# Patient Record
Sex: Male | Born: 1965 | Race: Black or African American | Hispanic: No | Marital: Married | Smoking: Former smoker
Health system: Southern US, Community
[De-identification: ages and names within clinical notes are randomized; demographics above are authoritative.]

## PROBLEM LIST (undated history)

## (undated) DIAGNOSIS — N201 Calculus of ureter: Secondary | ICD-10-CM

## (undated) DIAGNOSIS — I10 Essential (primary) hypertension: Secondary | ICD-10-CM

## (undated) DIAGNOSIS — G4733 Obstructive sleep apnea (adult) (pediatric): Secondary | ICD-10-CM

## (undated) DIAGNOSIS — D860 Sarcoidosis of lung: Secondary | ICD-10-CM

## (undated) DIAGNOSIS — N3289 Other specified disorders of bladder: Secondary | ICD-10-CM

## (undated) DIAGNOSIS — R319 Hematuria, unspecified: Secondary | ICD-10-CM

## (undated) DIAGNOSIS — N200 Calculus of kidney: Secondary | ICD-10-CM

---

## 2003-11-15 ENCOUNTER — Ambulatory Visit (HOSPITAL_COMMUNITY): Admission: RE | Admit: 2003-11-15 | Discharge: 2003-11-15 | Payer: Self-pay | Admitting: Gastroenterology

## 2003-11-15 ENCOUNTER — Encounter (INDEPENDENT_AMBULATORY_CARE_PROVIDER_SITE_OTHER): Payer: Self-pay | Admitting: *Deleted

## 2007-08-16 ENCOUNTER — Emergency Department (HOSPITAL_COMMUNITY): Admission: EM | Admit: 2007-08-16 | Discharge: 2007-08-16 | Payer: Self-pay | Admitting: Emergency Medicine

## 2010-11-30 NOTE — Op Note (Signed)
NAME:  Derek Bennett, Derek Bennett                          ACCOUNT NO.:  [id number]   MEDICAL RECORD NO.:  [id number]                   PATIENT TYPE:  AMB   LOCATION:  ENDO                                 FACILITY:  [facility]   PHYSICIAN:  John C. Madilyn Fireman, M.D.                 DATE OF BIRTH:  1966-06-12   DATE OF PROCEDURE:  11/15/2003  DATE OF DISCHARGE:                                 OPERATIVE REPORT   PROCEDURE:  Colonoscopy with polypectomy and biopsy.   INDICATION FOR PROCEDURE:  Rectal bleeding and polyp found on flexible  sigmoidoscopy.   PROCEDURE:  The patient was placed in the left lateral decubitus position  and placed on the pulse monitor with continuous low-flow oxygen delivered by  nasal cannula.  He was sedated with 100 mcg IV fentanyl and 10 mg IV Versed.  The Olympus video colonoscope was inserted into the rectum and advanced to  the cecum, confirmed by transillumination of McBurney's point and  visualization of the ileocecal valve and appendiceal orifice.  The prep was  good.  The cecum appeared normal with no masses, polyps, diverticula, or  other mucosal abnormalities.  Within the ascending colon there were several  raised, nodular-appearing areas that had normal overlying mucosa consistent  with submucosal nodules of unknown etiology.  There was no yellowish  appearance to suggest lipomas, and the nodules were somewhat firm when  palpated with closed biopsy forceps.  I took several biopsies of the mucosa  but was doubtful that I sampled the true nodules, which appeared to be  subcutaneous.  The remainder of the ascending, transverse, descending, and  sigmoid appeared normal with no further nodules, polyps, masses,  diverticula, or other mucosal abnormalities.  In the rectum at approximately  12 cm there was an 8 mm sessile polyp, which was removed by snare.  The  remainder of the rectum appeared normal.  The scope was then withdrawn and  the patient returned to the recovery room  in stable condition.  He tolerated  the procedure well, and there were no immediate complications.   IMPRESSION:  1. Subcutaneous ascending colon nodules, significance unclear, atypical in     appearance for polyps.  2. Single rectal polyp.   PLAN:  Await histology.                                               John C. Madilyn Fireman, M.D.    JCH/MEDQ  D:  11/15/2003  T:  11/15/2003  Job:  [id number]   cc:   Dellis Anes. Idell Pickles, M.D.  [address]  [place]  [place] [address]  Fax: [phone]

## 2012-04-09 ENCOUNTER — Emergency Department (HOSPITAL_COMMUNITY)
Admission: EM | Admit: 2012-04-09 | Discharge: 2012-04-09 | Disposition: A | Discharge status: Home / Self Care | Payer: Managed Care, Other (non HMO) | Source: Home / Self Care | Attending: Family Medicine | Admitting: Family Medicine

## 2012-04-09 ENCOUNTER — Encounter (HOSPITAL_COMMUNITY): Payer: Self-pay | Admitting: *Deleted

## 2012-04-09 DIAGNOSIS — IMO0001 Reserved for inherently not codable concepts without codable children: Secondary | ICD-10-CM

## 2012-04-09 DIAGNOSIS — L03019 Cellulitis of unspecified finger: Secondary | ICD-10-CM

## 2012-04-09 HISTORY — DX: Essential (primary) hypertension: I10

## 2012-04-09 MED ORDER — DOXYCYCLINE HYCLATE 100 MG PO CAPS: 100.0000 mg | ORAL_CAPSULE | Freq: Two times a day (BID) | ORAL | Status: DC

## 2012-04-09 NOTE — ED Provider Notes (Signed)
History     CSN: [id number]  Arrival date & time 04/09/12  1527   First MD Initiated Contact with Patient 04/09/12 1553      Chief Complaint  Patient presents with  . Hand Pain    (Consider location/radiation/quality/duration/timing/severity/associated sxs/prior treatment) Patient is a 46 y.o. male presenting with hand pain. The history is provided by the patient and the spouse.  Hand Pain This is a new problem. The current episode started 2 days ago. The problem has been gradually worsening.    Past Medical History  Diagnosis Date  . Hypertension     History reviewed. No pertinent past surgical history.  No family history on file.  History  Substance Use Topics  . Smoking status: Not on file  . Smokeless tobacco: Not on file  . Alcohol Use: No      Review of Systems  Constitutional: Negative.   Musculoskeletal: Positive for joint swelling.  Skin: Positive for rash.    Allergies  Review of patient's allergies indicates not on file.  Home Medications   Current Outpatient Rx  Name Route Sig Dispense Refill  . LISINOPRIL PO Oral Take by mouth.    . DOXYCYCLINE HYCLATE 100 MG PO CAPS Oral Take 1 capsule (100 mg total) by mouth 2 (two) times daily. 20 capsule 0    BP 114/71  Pulse 85  Temp 98.5 F (36.9 C) (Oral)  Resp 17  SpO2 97%  Physical Exam  Nursing note and vitals reviewed. Constitutional: He is oriented to person, place, and time. He appears well-developed and well-nourished.  Musculoskeletal: He exhibits tenderness.       Hands: Neurological: He is alert and oriented to person, place, and time.  Skin: Skin is warm and dry.    ED Course  INCISION AND DRAINAGE Date/Time: 04/09/2012 4:33 PM Performed by: Linna Hoff Authorized by: Bradd Canary D Consent: Verbal consent obtained. Consent given by: patient and spouse Type: abscess Body area: upper extremity Location details: right ring finger Local anesthetic: topical  anesthetic Patient sedated: no Scalpel size: 11 Incision type: single straight Drainage: purulent Drainage amount: moderate Patient tolerance: Patient tolerated the procedure well with no immediate complications. Comments: Culture obtained.   (including critical care time)   Labs Reviewed  WOUND CULTURE   No results found.   1. Paronychia of fourth finger, right       MDM          Linna Hoff, MD 04/09/12 [phone]

## 2012-04-09 NOTE — ED Notes (Signed)
Pt  Has  A  painfull   Swollen       r  Ring  Finger  With  Pus  Under the  Nailbed         Symptoms  X  sev  Days           Pt  denys    Any  Injury            The  Nailbed is  painfull  To the  Touch

## 2012-04-12 LAB — WOUND CULTURE: Special Requests: NORMAL

## 2012-04-15 ENCOUNTER — Telehealth (HOSPITAL_COMMUNITY): Payer: Self-pay | Admitting: *Deleted

## 2012-04-15 NOTE — ED Notes (Signed)
Wound culture hand: Abundant MRSA. Pt. adequately treated with Doxycycline.  I called and left a message to call. Vassie Moselle 04/15/2012

## 2012-04-16 ENCOUNTER — Telehealth (HOSPITAL_COMMUNITY): Payer: Self-pay | Admitting: *Deleted

## 2012-04-16 NOTE — ED Notes (Signed)
Pt. called back.  Pt. verified x 2 and given results.  Pt. told he was adequately treated for MRSA with Doxycycline. Pt. given MRSA instructions and voiced understanding. Pt. wanted me to tell his wife what I told him. I referred them to the [facility] website for further reading on it. Vassie Moselle 04/16/2012

## 2014-05-11 ENCOUNTER — Encounter (HOSPITAL_COMMUNITY): Payer: Self-pay | Admitting: Emergency Medicine

## 2014-05-11 ENCOUNTER — Emergency Department (HOSPITAL_COMMUNITY): Payer: PRIVATE HEALTH INSURANCE

## 2014-05-11 ENCOUNTER — Inpatient Hospital Stay (HOSPITAL_COMMUNITY)
Admission: EM | Admit: 2014-05-11 | Discharge: 2014-05-13 | DRG: 854 | Disposition: A | Discharge status: Home / Self Care | Payer: PRIVATE HEALTH INSURANCE | Attending: Internal Medicine | Admitting: Internal Medicine

## 2014-05-11 DIAGNOSIS — A419 Sepsis, unspecified organism: Principal | ICD-10-CM

## 2014-05-11 DIAGNOSIS — Y929 Unspecified place or not applicable: Secondary | ICD-10-CM

## 2014-05-11 DIAGNOSIS — I10 Essential (primary) hypertension: Secondary | ICD-10-CM

## 2014-05-11 DIAGNOSIS — B9620 Unspecified Escherichia coli [E. coli] as the cause of diseases classified elsewhere: Secondary | ICD-10-CM | POA: Diagnosis present

## 2014-05-11 DIAGNOSIS — S0003XA Contusion of scalp, initial encounter: Secondary | ICD-10-CM

## 2014-05-11 DIAGNOSIS — Z87891 Personal history of nicotine dependence: Secondary | ICD-10-CM | POA: Diagnosis not present

## 2014-05-11 DIAGNOSIS — D869 Sarcoidosis, unspecified: Secondary | ICD-10-CM | POA: Diagnosis present

## 2014-05-11 DIAGNOSIS — D862 Sarcoidosis of lung with sarcoidosis of lymph nodes: Secondary | ICD-10-CM

## 2014-05-11 DIAGNOSIS — R591 Generalized enlarged lymph nodes: Secondary | ICD-10-CM | POA: Insufficient documentation

## 2014-05-11 DIAGNOSIS — R000 Tachycardia, unspecified: Secondary | ICD-10-CM

## 2014-05-11 DIAGNOSIS — N390 Urinary tract infection, site not specified: Secondary | ICD-10-CM | POA: Diagnosis present

## 2014-05-11 DIAGNOSIS — R55 Syncope and collapse: Secondary | ICD-10-CM

## 2014-05-11 DIAGNOSIS — W1839XA Other fall on same level, initial encounter: Secondary | ICD-10-CM | POA: Diagnosis present

## 2014-05-11 DIAGNOSIS — R509 Fever, unspecified: Secondary | ICD-10-CM

## 2014-05-11 DIAGNOSIS — R590 Localized enlarged lymph nodes: Secondary | ICD-10-CM | POA: Insufficient documentation

## 2014-05-11 DIAGNOSIS — S0081XA Abrasion of other part of head, initial encounter: Secondary | ICD-10-CM | POA: Diagnosis present

## 2014-05-11 LAB — COMPREHENSIVE METABOLIC PANEL
ALT: 34 U/L (ref 0–53)
ANION GAP: 14 (ref 5–15)
AST: 31 U/L (ref 0–37)
Albumin: 3.4 g/dL — ABNORMAL LOW (ref 3.5–5.2)
Alkaline Phosphatase: 56 U/L (ref 39–117)
BILIRUBIN TOTAL: 0.5 mg/dL (ref 0.3–1.2)
BUN: 15 mg/dL (ref 6–23)
CHLORIDE: 98 meq/L (ref 96–112)
CO2: 20 meq/L (ref 19–32)
CREATININE: 1.24 mg/dL (ref 0.50–1.35)
Calcium: 9 mg/dL (ref 8.4–10.5)
GFR, EST AFRICAN AMERICAN: 77 mL/min — AB (ref >90)
GFR, EST NON AFRICAN AMERICAN: 67 mL/min — AB (ref >90)
Glucose, Bld: 104 mg/dL — ABNORMAL HIGH (ref 70–99)
Potassium: 4.3 mEq/L (ref 3.7–5.3)
Sodium: 132 mEq/L — ABNORMAL LOW (ref 137–147)
Total Protein: 8 g/dL (ref 6.0–8.3)

## 2014-05-11 LAB — URINE MICROSCOPIC-ADD ON

## 2014-05-11 LAB — URINALYSIS, ROUTINE W REFLEX MICROSCOPIC
Bilirubin Urine: NEGATIVE
Glucose, UA: NEGATIVE mg/dL
Hgb urine dipstick: NEGATIVE
KETONES UR: NEGATIVE mg/dL
NITRITE: POSITIVE — AB
PROTEIN: 30 mg/dL — AB
Specific Gravity, Urine: 1.021 (ref 1.005–1.030)
UROBILINOGEN UA: 1 mg/dL (ref 0.0–1.0)
pH: 7 (ref 5.0–8.0)

## 2014-05-11 LAB — CBC WITH DIFFERENTIAL/PLATELET
BASOS PCT: 0 % (ref 0–1)
Basophils Absolute: 0 10*3/uL (ref 0.0–0.1)
EOS PCT: 0 % (ref 0–5)
Eosinophils Absolute: 0 10*3/uL (ref 0.0–0.7)
HEMATOCRIT: 35.5 % — AB (ref 39.0–52.0)
HEMOGLOBIN: 12.6 g/dL — AB (ref 13.0–17.0)
LYMPHS PCT: 14 % (ref 12–46)
Lymphs Abs: 2 10*3/uL (ref 0.7–4.0)
MCH: 29.9 pg (ref 26.0–34.0)
MCHC: 35.5 g/dL (ref 30.0–36.0)
MCV: 84.3 fL (ref 78.0–100.0)
MONO ABS: 1.7 10*3/uL — AB (ref 0.1–1.0)
MONOS PCT: 11 % (ref 3–12)
NEUTROS ABS: 11.2 10*3/uL — AB (ref 1.7–7.7)
Neutrophils Relative %: 75 % (ref 43–77)
Platelets: 256 10*3/uL (ref 150–400)
RBC: 4.21 MIL/uL — ABNORMAL LOW (ref 4.22–5.81)
RDW: 13.2 % (ref 11.5–15.5)
WBC: 14.9 10*3/uL — ABNORMAL HIGH (ref 4.0–10.5)

## 2014-05-11 LAB — I-STAT CG4 LACTIC ACID, ED
LACTIC ACID, VENOUS: 0.95 mmol/L (ref 0.5–2.2)
Lactic Acid, Venous: 1.2 mmol/L (ref 0.5–2.2)

## 2014-05-11 LAB — TROPONIN I: Troponin I: <0.3 ng/mL (ref <0.30)

## 2014-05-11 MED ORDER — ATROPINE SULFATE 1 % OP SOLN
1.0000 [drp] | Freq: Every day | OPHTHALMIC | Status: DC
  Administered 2014-05-11: 1 [drp] via OPHTHALMIC
  Filled 2014-05-11: qty 2

## 2014-05-11 MED ORDER — DEXTROSE 5 % IV SOLN: 1.0000 g | INTRAVENOUS | Status: DC

## 2014-05-11 MED ORDER — ACETAMINOPHEN 650 MG RE SUPP: 650.0000 mg | Freq: Four times a day (QID) | RECTAL | Status: DC | PRN

## 2014-05-11 MED ORDER — ONDANSETRON HCL 4 MG/2ML IJ SOLN: 4.0000 mg | Freq: Four times a day (QID) | INTRAMUSCULAR | Status: DC | PRN

## 2014-05-11 MED ORDER — ACETAMINOPHEN 325 MG PO TABS: 650.0000 mg | ORAL_TABLET | Freq: Four times a day (QID) | ORAL | Status: DC | PRN

## 2014-05-11 MED ORDER — FOLIC ACID 1 MG PO TABS
1.0000 mg | ORAL_TABLET | Freq: Every day | ORAL | Status: DC
  Administered 2014-05-12 – 2014-05-13 (×2): 1 mg via ORAL
  Filled 2014-05-11 (×2): qty 1

## 2014-05-11 MED ORDER — HOMATROPINE HBR 5 % OP SOLN
1.0000 [drp] | Freq: Four times a day (QID) | OPHTHALMIC | Status: DC
  Filled 2014-05-11: qty 5

## 2014-05-11 MED ORDER — ADULT MULTIVITAMIN W/MINERALS CH
1.0000 | ORAL_TABLET | Freq: Every day | ORAL | Status: DC
  Administered 2014-05-12 – 2014-05-13 (×2): 1 via ORAL
  Filled 2014-05-11 (×2): qty 1

## 2014-05-11 MED ORDER — SODIUM CHLORIDE 0.9 % IV SOLN
INTRAVENOUS | Status: DC
  Administered 2014-05-12 – 2014-05-13 (×3): via INTRAVENOUS

## 2014-05-11 MED ORDER — ONDANSETRON HCL 4 MG PO TABS: 4.0000 mg | ORAL_TABLET | Freq: Four times a day (QID) | ORAL | Status: DC | PRN

## 2014-05-11 MED ORDER — SODIUM CHLORIDE 0.9 % IV BOLUS (SEPSIS)
1000.0000 mL | INTRAVENOUS | Status: AC
  Administered 2014-05-11 (×2): 1000 mL via INTRAVENOUS

## 2014-05-11 MED ORDER — HEPARIN SODIUM (PORCINE) 5000 UNIT/ML IJ SOLN
5000.0000 [IU] | Freq: Three times a day (TID) | INTRAMUSCULAR | Status: DC
  Administered 2014-05-11 – 2014-05-12 (×2): 5000 [IU] via SUBCUTANEOUS
  Filled 2014-05-11 (×5): qty 1

## 2014-05-11 MED ORDER — VITAMIN B-1 100 MG PO TABS
100.0000 mg | ORAL_TABLET | Freq: Every day | ORAL | Status: DC
  Administered 2014-05-12 – 2014-05-13 (×2): 100 mg via ORAL
  Filled 2014-05-11 (×2): qty 1

## 2014-05-11 MED ORDER — IOHEXOL 300 MG/ML  SOLN
100.0000 mL | Freq: Once | INTRAMUSCULAR | Status: AC | PRN
  Administered 2014-05-11: 100 mL via INTRAVENOUS

## 2014-05-11 MED ORDER — SODIUM CHLORIDE 0.9 % IV BOLUS (SEPSIS)
500.0000 mL | INTRAVENOUS | Status: AC
  Administered 2014-05-11: 500 mL via INTRAVENOUS

## 2014-05-11 MED ORDER — ACETAMINOPHEN 325 MG PO TABS
650.0000 mg | ORAL_TABLET | Freq: Four times a day (QID) | ORAL | Status: DC | PRN
Start: 2014-05-11 — End: 2014-05-11
  Administered 2014-05-11: 650 mg via ORAL
  Filled 2014-05-11: qty 2

## 2014-05-11 MED ORDER — CEFTRIAXONE SODIUM 2 G IJ SOLR
2.0000 g | Freq: Once | INTRAMUSCULAR | Status: AC
  Administered 2014-05-11: 2 g via INTRAVENOUS
  Filled 2014-05-11: qty 2

## 2014-05-11 NOTE — ED Notes (Signed)
Per EMS-pt had two syncopal episodes today. Head injury and abrasion to left cheek from fall. CBG 93. "seeing spots".

## 2014-05-11 NOTE — H&P (Signed)
[facility] History and Physical  RACER QUAM ZOX:[id number] DOB: [birth year] DOA: 05/11/2014  Referring physician: Dierdre Forth, PA PCP: Mickie Hillier, MD   Chief Complaint: Syncope  HPI: LINDSEY HOMMEL is a 48 y.o. male presents with syncopal episode. Patient states that he has had 2 episodes of syncope. Patient states that he was at work when this happened. He had no chest pain noted. No loss of bowel or bladder control. Patient states that he has had sweating noted. Patient states that he has no headaches noted. Patient states he has a he hit his face and has a bruise there now. He has not had dizziness or light headedness. No history of seizures noted. Patient states that he has had noted his urine to be foul smelling lately and has had decreased urine output. Patient has no prior cardiac history. He has not had strokes in the past. On evaluation he had a CT scan done and this shows significant adenopathy suggesting sarcoid vs lymphoma.   Review of Systems:  Constitutional:  No weight loss, ++night sweats, ++Fevers, ++chills, ++fatigue.  HEENT:  No headaches,  post nasal drip,  Cardio-vascular:  No chest pain, Orthopnea, PND, swelling in lower extremities ++syncope  GI:  No heartburn, indigestion, abdominal pain, nausea, vomiting, diarrhea  Resp:  No shortness of breath with exertion or at rest. ++non-productive cough No coughing up of blood  Skin:  no rash or lesions GU:  ++dysuria, ++change in color of urine, ++smell of urine.  Musculoskeletal:  No joint pain or swelling. No decreased range of motion.  Psych:  No change in mood or affect. No depression or anxiety   Past Medical History  Diagnosis Date  . Hypertension    Past Surgical History  Procedure Laterality Date  . No past surgeries     Social History:  reports that he quit smoking about 25 years ago. His smoking use included Cigars. He has never used smokeless tobacco. He reports that he  drinks alcohol. He reports that he does not use illicit drugs.  Allergies  Allergen Reactions  . Eggs Or Egg-Derived Products Shortness Of Breath    History reviewed. No pertinent family history.   Prior to Admission medications   Medication Sig Start Date End Date Taking? Authorizing Provider  atropine 1 % ophthalmic solution Place 1 drop into both eyes at bedtime.   Yes Historical Provider, MD  homatropine 5 % ophthalmic solution Place 1 drop into both eyes 4 (four) times daily.   Yes Historical Provider, MD  lisinopril-hydrochlorothiazide (PRINZIDE,ZESTORETIC) 20-12.5 MG per tablet Take 1 tablet by mouth daily.   Yes Historical Provider, MD   Physical Exam: Filed Vitals:   05/11/14 1842 05/11/14 1844 05/11/14 2015 05/11/14 2026  BP:  115/60  144/55  Pulse:  105 100   Temp: 102.8 F (39.3 C)   99.4 F (37.4 C)  TempSrc: Rectal   Oral  Resp:  23 19   Weight:      SpO2:  97% 96%     Wt Readings from Last 3 Encounters:  05/11/14 110.678 kg (244 lb)    General:  Appears calm and comfortable Eyes: PERRL, normal lids, irises & conjunctiva ENT: grossly normal hearing, lips & tongue Neck: no masses or thyromegaly Cardiovascular: RRR tachycardia , no m/r/g. No LE edema Respiratory: CTA bilaterally, no w/r/r. Normal respiratory effort. Abdomen: soft, ntnd Skin: no rash or induration seen on limited exam Musculoskeletal: grossly normal tone BUE/BLE Psychiatric: grossly normal mood and affect,  speech fluent and appropriate Neurologic: grossly non-focal.          Labs on Admission:  Basic Metabolic Panel:  Recent Labs Lab 05/11/14 1806  NA 132*  K 4.3  CL 98  CO2 20  GLUCOSE 104*  BUN 15  CREATININE 1.24  CALCIUM 9.0   Liver Function Tests:  Recent Labs Lab 05/11/14 1806  AST 31  ALT 34  ALKPHOS 56  BILITOT 0.5  PROT 8.0  ALBUMIN 3.4*   No results found for this basename: LIPASE, AMYLASE,  in the last 168 hours No results found for this basename:  AMMONIA,  in the last 168 hours CBC:  Recent Labs Lab 05/11/14 1806  WBC 14.9*  NEUTROABS 11.2*  HGB 12.6*  HCT 35.5*  MCV 84.3  PLT 256   Cardiac Enzymes:  Recent Labs Lab 05/11/14 1806  TROPONINI <0.30    BNP (last 3 results) No results found for this basename: PROBNP,  in the last 8760 hours CBG: No results found for this basename: GLUCAP,  in the last 168 hours  Radiological Exams on Admission: Dg Chest 2 View  05/11/2014   CLINICAL DATA:  Syncope.  R55.  EXAM: CHEST  2 VIEW  COMPARISON:  08/16/2007 chest radiograph and cervical spine CT of earlier today  FINDINGS: Midline trachea. Normal heart size. Extensive mediastinal and bilateral hilar adenopathy. No pleural effusion or pneumothorax. Pulmonary interstitial prominence is new since the prior radiograph. Cannot exclude nodule projecting over the right costophrenic angle on the frontal film. 1.3 cm. Not well localized on the lateral.  IMPRESSION: Extensive mediastinal and bilateral hilar adenopathy. Concurrent pulmonary interstitial thickening. Considerations include sarcoidosis, lymphoproliferative disorder such as lymphoma, or small cell lung carcinoma. Further evaluation with contrast-enhanced chest CT should be considered. These results were called by telephone at the time of interpretation on 05/11/2014 at 6:28 pm to Dr. Dierdre ForthHANNAH MUTHERSBAUGH , who verbally acknowledged these results.  Possible right lung base nodule laterally. This would also be better evaluated with chest CT.   Electronically Signed   By: Jeronimo GreavesKyle  Talbot M.D.   On: 05/11/2014 18:31   Ct Head Wo Contrast  05/11/2014   CLINICAL DATA:  Two syncopal episodes.  Fall.  Scalp contusion.  EXAM: CT HEAD WITHOUT CONTRAST  CT CERVICAL SPINE WITHOUT CONTRAST  TECHNIQUE: Multidetector CT imaging of the head and cervical spine was performed following the standard protocol without intravenous contrast. Multiplanar CT image reconstructions of the cervical spine were also  generated.  COMPARISON:  None.  FINDINGS: CT HEAD FINDINGS  Skull and Sinuses:Negative for acute fracture or destructive process. Deformity of the nasal arch appears chronic. No effusion in the imaged paranasal sinuses. Mastoids and middle ears are clear.  Orbits: No acute abnormality.  Brain: No evidence of acute abnormality, such as acute infarction, hemorrhage, hydrocephalus, or mass lesion/mass effect.  CT CERVICAL SPINE FINDINGS  Partially visualized adenopathy in the upper mediastinum. A right upper paratracheal node measures at least 13 mm in short axis. Right supraclavicular lymphadenopathy with a node measuring 2 cm in short axis.  Negative for acute fracture. No traumatic malalignment. Cervical spondylosis with small central disc herniations, most notable at C3-4 and C4-5. These likely contact the ventral cord. No gross cervical canal hematoma or prevertebral swelling.  IMPRESSION: 1. No acute intracranial injury or cervical spine fracture. 2. Upper mediastinal and right supraclavicular lymphadenopathy. Recommend enhanced chest CT.   Electronically Signed   By: Tiburcio PeaJonathan  Watts M.D.   On: 05/11/2014 18:20  Ct Chest W Contrast  05/11/2014   CLINICAL DATA:  Thoracic adenopathy on chest radiograph.Lymphadenopathy R59.1 (ICD-10-CM)  EXAM: CT CHEST, ABDOMEN, AND PELVIS WITH CONTRAST  TECHNIQUE: Multidetector CT imaging of the chest, abdomen and pelvis was performed following the standard protocol during bolus administration of intravenous contrast.  CONTRAST:  100mL OMNIPAQUE IOHEXOL 300 MG/ML  SOLN  COMPARISON:  Chest radiograph of same date and back to 08/16/2007  FINDINGS: CT CHEST FINDINGS  Lungs/Pleura: Probable scarring in the right lower lobe laterally. This corresponds to the plain film abnormality. Example image 53 of series 7.  Bronchovascular nodularity which is bilateral and slightly upper lobe predominant.  No pleural fluid.  Heart/Mediastinum: Right supraclavicular node measures 1.2 cm on  image 4.  No axillary adenopathy. Heart size upper normal, without pericardial effusion. LAD coronary artery atherosclerosis on image 29.  Extensive thoracic adenopathy. Right paratracheal nodal conglomerate measures 4.1 x 2.6 cm on image 21.  Subcarinal nodal mass measures 4.6 x 8.5 cm on image 32.  Right hilar node measures 2.5 cm on image 28. Left infrahilar adenopathy.  Index prevascular node measures 2.6 cm on image 20.  CT ABDOMEN AND PELVIS FINDINGS  Hepatobiliary: Too small to characterize right hepatic lobe lesion at 3 mm. Normal gallbladder, without biliary ductal dilatation.  Pancreas: Normal, without mass or pancreatic ductal dilatation.  Spleen: Normal, without splenomegaly.  Urinary tract: Normal adrenal glands. Interpolar right renal lesion which is too small to characterize. Normal ureters and urinary bladder.  Stomach/Bowel: Normal stomach, without wall thickening. Proximal duodenum is displaced by adenopathy, but nonobstructed. Normal colon, appendix, and terminal ileum. Normal small bowel, without mesenteric adenopathy.  Vascular/Lymphatic: Mild atherosclerosis. Marked upper abdominal adenopathy. Gastrohepatic ligament node measures 3.7 x 5.0 cm on image 60  Portacaval node which measures 4.6 x 5.6 cm on image 70. No pelvic adenopathy.  Reproductive: Normal prostate.  Other:  No significant free fluid.  Fat containing umbilical hernia.  Musculoskeletal: No acute osseous abnormality.  IMPRESSION: 1. Extensive thoracic and upper abdominal adenopathy with peribronchovascular pulmonary micro nodularity. Favor sarcoidosis. Lymphoma is felt less likely. 2. No acute superimposed process. The right lower lobe abnormality on chest radiograph likely represents an area of scarring versus inflammation related to presumed sarcoidosis. 3. Age advanced coronary artery atherosclerosis. Recommend assessment of coronary risk factors and consideration of medical therapy.   Electronically Signed   By: Jeronimo GreavesKyle  Talbot  M.D.   On: 05/11/2014 19:51   Ct Cervical Spine Wo Contrast  05/11/2014   CLINICAL DATA:  Two syncopal episodes.  Fall.  Scalp contusion.  EXAM: CT HEAD WITHOUT CONTRAST  CT CERVICAL SPINE WITHOUT CONTRAST  TECHNIQUE: Multidetector CT imaging of the head and cervical spine was performed following the standard protocol without intravenous contrast. Multiplanar CT image reconstructions of the cervical spine were also generated.  COMPARISON:  None.  FINDINGS: CT HEAD FINDINGS  Skull and Sinuses:Negative for acute fracture or destructive process. Deformity of the nasal arch appears chronic. No effusion in the imaged paranasal sinuses. Mastoids and middle ears are clear.  Orbits: No acute abnormality.  Brain: No evidence of acute abnormality, such as acute infarction, hemorrhage, hydrocephalus, or mass lesion/mass effect.  CT CERVICAL SPINE FINDINGS  Partially visualized adenopathy in the upper mediastinum. A right upper paratracheal node measures at least 13 mm in short axis. Right supraclavicular lymphadenopathy with a node measuring 2 cm in short axis.  Negative for acute fracture. No traumatic malalignment. Cervical spondylosis with small central disc herniations, most notable  at C3-4 and C4-5. These likely contact the ventral cord. No gross cervical canal hematoma or prevertebral swelling.  IMPRESSION: 1. No acute intracranial injury or cervical spine fracture. 2. Upper mediastinal and right supraclavicular lymphadenopathy. Recommend enhanced chest CT.   Electronically Signed   By: Tiburcio Pea M.D.   On: 05/11/2014 18:20   Ct Abdomen Pelvis W Contrast  05/11/2014   CLINICAL DATA:  Thoracic adenopathy on chest radiograph.Lymphadenopathy R59.1 (ICD-10-CM)  EXAM: CT CHEST, ABDOMEN, AND PELVIS WITH CONTRAST  TECHNIQUE: Multidetector CT imaging of the chest, abdomen and pelvis was performed following the standard protocol during bolus administration of intravenous contrast.  CONTRAST:  OMNIPAQUE IOHEXOL  300 MG/ML  SOLN  COMPARISON:  Chest radiograph of same date and back to 08/16/2007  FINDINGS: CT CHEST FINDINGS  Lungs/Pleura: Probable scarring in the right lower lobe laterally. This corresponds to the plain film abnormality. Example image 53 of series 7.  Bronchovascular nodularity which is bilateral and slightly upper lobe predominant.  No pleural fluid.  Heart/Mediastinum: Right supraclavicular node measures 1.2 cm on image 4.  No axillary adenopathy. Heart size upper normal, without pericardial effusion. LAD coronary artery atherosclerosis on image 29.  Extensive thoracic adenopathy. Right paratracheal nodal conglomerate measures 4.1 x 2.6 cm on image 21.  Subcarinal nodal mass measures 4.6 x 8.5 cm on image 32.  Right hilar node measures 2.5 cm on image 28. Left infrahilar adenopathy.  Index prevascular node measures 2.6 cm on image 20.  CT ABDOMEN AND PELVIS FINDINGS  Hepatobiliary: Too small to characterize right hepatic lobe lesion at 3 mm. Normal gallbladder, without biliary ductal dilatation.  Pancreas: Normal, without mass or pancreatic ductal dilatation.  Spleen: Normal, without splenomegaly.  Urinary tract: Normal adrenal glands. Interpolar right renal lesion which is too small to characterize. Normal ureters and urinary bladder.  Stomach/Bowel: Normal stomach, without wall thickening. Proximal duodenum is displaced by adenopathy, but nonobstructed. Normal colon, appendix, and terminal ileum. Normal small bowel, without mesenteric adenopathy.  Vascular/Lymphatic: Mild atherosclerosis. Marked upper abdominal adenopathy. Gastrohepatic ligament node measures 3.7 x 5.0 cm on image 60  Portacaval node which measures 4.6 x 5.6 cm on image 70. No pelvic adenopathy.  Reproductive: Normal prostate.  Other:  No significant free fluid.  Fat containing umbilical hernia.  Musculoskeletal: No acute osseous abnormality.  IMPRESSION: 1. Extensive thoracic and upper abdominal adenopathy with peribronchovascular  pulmonary micro nodularity. Favor sarcoidosis. Lymphoma is felt less likely. 2. No acute superimposed process. The right lower lobe abnormality on chest radiograph likely represents an area of scarring versus inflammation related to presumed sarcoidosis. 3. Age advanced coronary artery atherosclerosis. Recommend assessment of coronary risk factors and consideration of medical therapy.   Electronically Signed   By: Jeronimo Greaves M.D.   On: 05/11/2014 19:51     Assessment/Plan Active Problems:   Hypertension   Mediastinal adenopathy   Sepsis   UTI (lower urinary tract infection)   1. Sepsis -currently he is hemodynamically stable -patient will be started on rocephin -cultures have been ordered -will continue with fluid boluses and then change to maintenance fluids  2. UTI -await cultures -will start on rocephin -adjust based on cultures  3. Hypertension -hold home medications -monitor pressures closely  4. 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 Syncope -likely related to UTI and sepsis -will get echo and also will get carotid doppler    Code Status: Full Code (must  indicate code status--if unknown or must be presumed, indicate so) DVT Prophylaxis:Heparin Family Communication: Wife (indicate person spoken with, if applicable, with phone number if by telephone) Disposition Plan: Home (indicate anticipated LOS)  Time spent:  [facility] A [facility] Pager [phone]

## 2014-05-11 NOTE — ED Provider Notes (Signed)
CSN: [id number]     Arrival date & time 05/11/14  1644 History   First MD Initiated Contact with Patient 05/11/14 1645     Chief Complaint  Patient presents with  . Loss of Consciousness     (Consider location/radiation/quality/duration/timing/severity/associated sxs/prior Treatment) Patient is a 48 y.o. male presenting with syncope. The history is provided by the patient and medical records. No language interpreter was used.  Loss of Consciousness Associated symptoms: fever   Associated symptoms: no chest pain, no diaphoresis, no headaches, no nausea, no shortness of breath and no vomiting     Derek Bennett is a 48 y.o. male  with a hx of HTN presents to the Emergency Department complaining of acute syncope x2 today onset this morning.  Pt reports his daughter is ill, but he has not felt poorly until the first syncopal episode.  Patient reports full syncopal episode, hitting his head on the ground. He reports afterwards he was immediately alert and oriented without confusion or feeling ill. Patient reports a prodrome of feeling hot, room spinning, dry mouth and feeling short of breath. Patient denies previous episodes of syncope. He denies chest pain or diaphoresis prior to the stent. Patient has no cardiac history. Patient does endorse foul-smelling urine for the last several days but denies dysuria. Patient denies abdominal pain, nausea, vomiting, diarrhea, weakness, dizziness, syncopal, dysuria, hematuria.   Past Medical History  Diagnosis Date  . Hypertension    Past Surgical History  Procedure Laterality Date  . No past surgeries     History reviewed. No pertinent family history. History  Substance Use Topics  . Smoking status: Former Smoker    Types: Cigars    Quit date: 05/11/1989  . Smokeless tobacco: Never Used  . Alcohol Use: Yes     Comment: occasional    Review of Systems  Constitutional: Positive for fever. Negative for diaphoresis, appetite change, fatigue and  unexpected weight change.  HENT: Negative for mouth sores.   Eyes: Negative for visual disturbance.  Respiratory: Negative for cough, chest tightness, shortness of breath and wheezing.   Cardiovascular: Positive for syncope. Negative for chest pain.  Gastrointestinal: Positive for abdominal pain (lower). Negative for nausea, vomiting, diarrhea and constipation.  Endocrine: Negative for polydipsia, polyphagia and polyuria.  Genitourinary: Negative for dysuria, urgency, frequency and hematuria.       Foul smelling urine  Musculoskeletal: Negative for back pain and neck stiffness.  Skin: Negative for rash.  Allergic/Immunologic: Negative for immunocompromised state.  Neurological: Positive for syncope. Negative for light-headedness and headaches.  Hematological: Does not bruise/bleed easily.  Psychiatric/Behavioral: Negative for sleep disturbance. The patient is not nervous/anxious.       Allergies  Eggs or egg-derived products  Home Medications   Prior to Admission medications   Medication Sig Start Date End Date Taking? Authorizing Provider  atropine 1 % ophthalmic solution Place 1 drop into both eyes at bedtime.   Yes Historical Provider, MD  homatropine 5 % ophthalmic solution Place 1 drop into both eyes 4 (four) times daily.   Yes Historical Provider, MD  lisinopril-hydrochlorothiazide (PRINZIDE,ZESTORETIC) 20-12.5 MG per tablet Take 1 tablet by mouth daily.   Yes Historical Provider, MD   BP 144/55  Pulse 100  Temp(Src) 99.4 F (37.4 C) (Oral)  Resp 19  Wt 244 lb (110.678 kg)  SpO2 96% Physical Exam  Nursing note and vitals reviewed. Constitutional: He is oriented to person, place, and time. He appears well-developed and well-nourished. No distress.  Awake,  alert, nontoxic appearance  HENT:  Head: Normocephalic and atraumatic.  Right Ear: Tympanic membrane, external ear and ear canal normal.  Left Ear: Tympanic membrane, external ear and ear canal normal.  Nose: Nose  normal. No epistaxis. Right sinus exhibits no maxillary sinus tenderness and no frontal sinus tenderness. Left sinus exhibits no maxillary sinus tenderness and no frontal sinus tenderness.  Mouth/Throat: Uvula is midline, oropharynx is clear and moist and mucous membranes are normal. Mucous membranes are not pale and not cyanotic. No oropharyngeal exudate, posterior oropharyngeal edema, posterior oropharyngeal erythema or tonsillar abscesses.  Eyes: Conjunctivae are normal. Pupils are equal, round, and reactive to light. No scleral icterus.  Neck: Normal range of motion and full passive range of motion without pain. Neck supple.  Cardiovascular: Normal rate, regular rhythm, normal heart sounds and intact distal pulses.   Tachycardia  Pulmonary/Chest: Effort normal and breath sounds normal. No stridor. No respiratory distress. He has no wheezes. He has no rales.  Equal chest expansion Clear and equal breath sounds  Abdominal: Soft. Bowel sounds are normal. He exhibits no mass. There is tenderness in the suprapubic area and left lower quadrant. There is no rebound, no guarding and no CVA tenderness.  Suprapubic abdominal tenderness without guarding, rebound or peritoneal signs; no CVA tenderness  Musculoskeletal: Normal range of motion. He exhibits no edema.  Lymphadenopathy:    He has no cervical adenopathy.  Neurological: He is alert and oriented to person, place, and time.  Speech is clear and goal oriented Moves extremities without ataxia  Skin: Skin is warm and dry. No rash noted. He is not diaphoretic. No erythema.  Skin hot to touch  Psychiatric: He has a normal mood and affect.    ED Course  Procedures (including critical care time) Labs Review Labs Reviewed  CBC WITH DIFFERENTIAL - Abnormal; Notable for the following:    WBC 14.9 (*)    RBC 4.21 (*)    Hemoglobin 12.6 (*)    HCT 35.5 (*)    Neutro Abs 11.2 (*)    Monocytes Absolute 1.7 (*)    All other components within normal  limits  COMPREHENSIVE METABOLIC PANEL - Abnormal; Notable for the following:    Sodium 132 (*)    Glucose, Bld 104 (*)    Albumin 3.4 (*)    GFR calc non Af Amer 67 (*)    GFR calc Af Amer 77 (*)    All other components within normal limits  URINALYSIS, ROUTINE W REFLEX MICROSCOPIC - Abnormal; Notable for the following:    APPearance CLOUDY (*)    Protein, ur 30 (*)    Nitrite POSITIVE (*)    Leukocytes, UA SMALL (*)    All other components within normal limits  URINE MICROSCOPIC-ADD ON - Abnormal; Notable for the following:    Squamous Epithelial / LPF FEW (*)    Bacteria, UA MANY (*)    All other components within normal limits  CULTURE, BLOOD (ROUTINE X 2)  CULTURE, BLOOD (ROUTINE X 2)  URINE CULTURE  GC/CHLAMYDIA PROBE AMP  TROPONIN I  I-STAT CG4 LACTIC ACID, ED  I-STAT CG4 LACTIC ACID, ED    Imaging Review Dg Chest 2 View  05/11/2014   CLINICAL DATA:  Syncope.  R55.  EXAM: CHEST  2 VIEW  COMPARISON:  08/16/2007 chest radiograph and cervical spine CT of earlier today  FINDINGS: Midline trachea. Normal heart size. Extensive mediastinal and bilateral hilar adenopathy. No pleural effusion or pneumothorax. Pulmonary interstitial prominence is  new since the prior radiograph. Cannot exclude nodule projecting over the right costophrenic angle on the frontal film. 1.3 cm. Not well localized on the lateral.  IMPRESSION: Extensive mediastinal and bilateral hilar adenopathy. Concurrent pulmonary interstitial thickening. Considerations include sarcoidosis, lymphoproliferative disorder such as lymphoma, or small cell lung carcinoma. Further evaluation with contrast-enhanced chest CT should be considered. These results were called by telephone at the time of interpretation on 05/11/2014 at 6:28 pm to Dr. Dierdre Forth , who verbally acknowledged these results.  Possible right lung base nodule laterally. This would also be better evaluated with chest CT.   Electronically Signed   By: Jeronimo Greaves M.D.   On: 05/11/2014 18:31   Ct Head Wo Contrast  05/11/2014   CLINICAL DATA:  Two syncopal episodes.  Fall.  Scalp contusion.  EXAM: CT HEAD WITHOUT CONTRAST  CT CERVICAL SPINE WITHOUT CONTRAST  TECHNIQUE: Multidetector CT imaging of the head and cervical spine was performed following the standard protocol without intravenous contrast. Multiplanar CT image reconstructions of the cervical spine were also generated.  COMPARISON:  None.  FINDINGS: CT HEAD FINDINGS  Skull and Sinuses:Negative for acute fracture or destructive process. Deformity of the nasal arch appears chronic. No effusion in the imaged paranasal sinuses. Mastoids and middle ears are clear.  Orbits: No acute abnormality.  Brain: No evidence of acute abnormality, such as acute infarction, hemorrhage, hydrocephalus, or mass lesion/mass effect.  CT CERVICAL SPINE FINDINGS  Partially visualized adenopathy in the upper mediastinum. A right upper paratracheal node measures at least 13 mm in short axis. Right supraclavicular lymphadenopathy with a node measuring 2 cm in short axis.  Negative for acute fracture. No traumatic malalignment. Cervical spondylosis with small central disc herniations, most notable at C3-4 and C4-5. These likely contact the ventral cord. No gross cervical canal hematoma or prevertebral swelling.  IMPRESSION: 1. No acute intracranial injury or cervical spine fracture. 2. Upper mediastinal and right supraclavicular lymphadenopathy. Recommend enhanced chest CT.   Electronically Signed   By: Tiburcio Pea M.D.   On: 05/11/2014 18:20   Ct Chest W Contrast  05/11/2014   CLINICAL DATA:  Thoracic adenopathy on chest radiograph.Lymphadenopathy R59.1 (ICD-10-CM)  EXAM: CT CHEST, ABDOMEN, AND PELVIS WITH CONTRAST  TECHNIQUE: Multidetector CT imaging of the chest, abdomen and pelvis was performed following the standard protocol during bolus administration of intravenous contrast.  CONTRAST:  OMNIPAQUE IOHEXOL 300 MG/ML   SOLN  COMPARISON:  Chest radiograph of same date and back to 08/16/2007  FINDINGS: CT CHEST FINDINGS  Lungs/Pleura: Probable scarring in the right lower lobe laterally. This corresponds to the plain film abnormality. Example image 53 of series 7.  Bronchovascular nodularity which is bilateral and slightly upper lobe predominant.  No pleural fluid.  Heart/Mediastinum: Right supraclavicular node measures 1.2 cm on image 4.  No axillary adenopathy. Heart size upper normal, without pericardial effusion. LAD coronary artery atherosclerosis on image 29.  Extensive thoracic adenopathy. Right paratracheal nodal conglomerate measures 4.1 x 2.6 cm on image 21.  Subcarinal nodal mass measures 4.6 x 8.5 cm on image 32.  Right hilar node measures 2.5 cm on image 28. Left infrahilar adenopathy.  Index prevascular node measures 2.6 cm on image 20.  CT ABDOMEN AND PELVIS FINDINGS  Hepatobiliary: Too small to characterize right hepatic lobe lesion at 3 mm. Normal gallbladder, without biliary ductal dilatation.  Pancreas: Normal, without mass or pancreatic ductal dilatation.  Spleen: Normal, without splenomegaly.  Urinary tract: Normal adrenal glands. Interpolar right  renal lesion which is too small to characterize. Normal ureters and urinary bladder.  Stomach/Bowel: Normal stomach, without wall thickening. Proximal duodenum is displaced by adenopathy, but nonobstructed. Normal colon, appendix, and terminal ileum. Normal small bowel, without mesenteric adenopathy.  Vascular/Lymphatic: Mild atherosclerosis. Marked upper abdominal adenopathy. Gastrohepatic ligament node measures 3.7 x 5.0 cm on image 60  Portacaval node which measures 4.6 x 5.6 cm on image 70. No pelvic adenopathy.  Reproductive: Normal prostate.  Other:  No significant free fluid.  Fat containing umbilical hernia.  Musculoskeletal: No acute osseous abnormality.  IMPRESSION: 1. Extensive thoracic and upper abdominal adenopathy with peribronchovascular pulmonary micro  nodularity. Favor sarcoidosis. Lymphoma is felt less likely. 2. No acute superimposed process. The right lower lobe abnormality on chest radiograph likely represents an area of scarring versus inflammation related to presumed sarcoidosis. 3. Age advanced coronary artery atherosclerosis. Recommend assessment of coronary risk factors and consideration of medical therapy.   Electronically Signed   By: Jeronimo GreavesKyle  Talbot M.D.   On: 05/11/2014 19:51   Ct Cervical Spine Wo Contrast  05/11/2014   CLINICAL DATA:  Two syncopal episodes.  Fall.  Scalp contusion.  EXAM: CT HEAD WITHOUT CONTRAST  CT CERVICAL SPINE WITHOUT CONTRAST  TECHNIQUE: Multidetector CT imaging of the head and cervical spine was performed following the standard protocol without intravenous contrast. Multiplanar CT image reconstructions of the cervical spine were also generated.  COMPARISON:  None.  FINDINGS: CT HEAD FINDINGS  Skull and Sinuses:Negative for acute fracture or destructive process. Deformity of the nasal arch appears chronic. No effusion in the imaged paranasal sinuses. Mastoids and middle ears are clear.  Orbits: No acute abnormality.  Brain: No evidence of acute abnormality, such as acute infarction, hemorrhage, hydrocephalus, or mass lesion/mass effect.  CT CERVICAL SPINE FINDINGS  Partially visualized adenopathy in the upper mediastinum. A right upper paratracheal node measures at least 13 mm in short axis. Right supraclavicular lymphadenopathy with a node measuring 2 cm in short axis.  Negative for acute fracture. No traumatic malalignment. Cervical spondylosis with small central disc herniations, most notable at C3-4 and C4-5. These likely contact the ventral cord. No gross cervical canal hematoma or prevertebral swelling.  IMPRESSION: 1. No acute intracranial injury or cervical spine fracture. 2. Upper mediastinal and right supraclavicular lymphadenopathy. Recommend enhanced chest CT.   Electronically Signed   By: Tiburcio PeaJonathan  Watts M.D.    On: 05/11/2014 18:20   Ct Abdomen Pelvis W Contrast  05/11/2014   CLINICAL DATA:  Thoracic adenopathy on chest radiograph.Lymphadenopathy R59.1 (ICD-10-CM)  EXAM: CT CHEST, ABDOMEN, AND PELVIS WITH CONTRAST  TECHNIQUE: Multidetector CT imaging of the chest, abdomen and pelvis was performed following the standard protocol during bolus administration of intravenous contrast.  CONTRAST:  100mL OMNIPAQUE IOHEXOL 300 MG/ML  SOLN  COMPARISON:  Chest radiograph of same date and back to 08/16/2007  FINDINGS: CT CHEST FINDINGS  Lungs/Pleura: Probable scarring in the right lower lobe laterally. This corresponds to the plain film abnormality. Example image 53 of series 7.  Bronchovascular nodularity which is bilateral and slightly upper lobe predominant.  No pleural fluid.  Heart/Mediastinum: Right supraclavicular node measures 1.2 cm on image 4.  No axillary adenopathy. Heart size upper normal, without pericardial effusion. LAD coronary artery atherosclerosis on image 29.  Extensive thoracic adenopathy. Right paratracheal nodal conglomerate measures 4.1 x 2.6 cm on image 21.  Subcarinal nodal mass measures 4.6 x 8.5 cm on image 32.  Right hilar node measures 2.5 cm on image 28. Left  infrahilar adenopathy.  Index prevascular node measures 2.6 cm on image 20.  CT ABDOMEN AND PELVIS FINDINGS  Hepatobiliary: Too small to characterize right hepatic lobe lesion at 3 mm. Normal gallbladder, without biliary ductal dilatation.  Pancreas: Normal, without mass or pancreatic ductal dilatation.  Spleen: Normal, without splenomegaly.  Urinary tract: Normal adrenal glands. Interpolar right renal lesion which is too small to characterize. Normal ureters and urinary bladder.  Stomach/Bowel: Normal stomach, without wall thickening. Proximal duodenum is displaced by adenopathy, but nonobstructed. Normal colon, appendix, and terminal ileum. Normal small bowel, without mesenteric adenopathy.  Vascular/Lymphatic: Mild atherosclerosis. Marked  upper abdominal adenopathy. Gastrohepatic ligament node measures 3.7 x 5.0 cm on image 60  Portacaval node which measures 4.6 x 5.6 cm on image 70. No pelvic adenopathy.  Reproductive: Normal prostate.  Other:  No significant free fluid.  Fat containing umbilical hernia.  Musculoskeletal: No acute osseous abnormality.  IMPRESSION: 1. Extensive thoracic and upper abdominal adenopathy with peribronchovascular pulmonary micro nodularity. Favor sarcoidosis. Lymphoma is felt less likely. 2. No acute superimposed process. The right lower lobe abnormality on chest radiograph likely represents an area of scarring versus inflammation related to presumed sarcoidosis. 3. Age advanced coronary artery atherosclerosis. Recommend assessment of coronary risk factors and consideration of medical therapy.   Electronically Signed   By: Jeronimo Greaves M.D.   On: 05/11/2014 19:51     EKG Interpretation   Date/Time:  Wednesday May 11 2014 18:00:39 EDT Ventricular Rate:  116 PR Interval:  145 QRS Duration: 92 QT Interval:  336 QTC Calculation: 467 R Axis:   75 Text Interpretation:  Sinus tachycardia Low voltage, extremity leads  Confirmed by KNAPP  MD-J, JON ([id number]) on 05/11/2014 6:09:06 PM      MDM   Final diagnoses:  Syncope  Scalp contusion  Lymphadenopathy  UTI (lower urinary tract infection)  Fever, unspecified fever cause  Tachycardia   Derek Bennett presents with syncope x2 today, fever and tachycardia.  Will obtain CT head, neck and begin sepsis work-up.    6:31PM Discussed with Dr. Reche Dixon who reports significant lymphadenopathy on chest x-ray and recommend CT chest. Patient with lower abdominal pain will also CT abdomen.  8:15 PM Extensive thoracic and upper abdominal adenopathy with pulmonary marker nodularity favoring sarcoidosis and less likely to be lymphoma.  Leukocytosis of 14.9 but normal lactic acid. Labs otherwise reassuring.    Urine with evidence of urinary tract infection.  We'll begin antibiotics for UTI and plan for admission.  No episodes of hypotension while in the emergency department.  BP 144/55  Pulse 100  Temp(Src) 99.4 F (37.4 C) (Oral)  Resp 19  Wt 244 lb (110.678 kg)  SpO2 96%   9:15 PM Pt will be admitted to Med Surg by Dr. Welton Flakes.    Dahlia Client Xitlalic Maslin, PA-C 05/11/14 2115

## 2014-05-11 NOTE — Progress Notes (Signed)
ANTIBIOTIC CONSULT NOTE - INITIAL  Pharmacy Consult for Ceftriaxone Indication: urosepsis  Allergies  Allergen Reactions  . Eggs Or Egg-Derived Products Shortness Of Breath    Patient Measurements: Weight: 244 lb (110.678 kg)  Vital Signs: Temp: 99.4 F (37.4 C) (10/28 2026) Temp Source: Oral (10/28 2026) BP: 144/55 mmHg (10/28 2026) Pulse Rate: 100 (10/28 2015) Intake/Output from previous day:   Intake/Output from this shift:    Labs:  Recent Labs  05/11/14 1806  WBC 14.9*  HGB 12.6*  PLT 256  CREATININE 1.24   CrCl is unknown because there is no height on file for the current visit. No results found for this basename: VANCOTROUGH, VANCOPEAK, VANCORANDOM, GENTTROUGH, GENTPEAK, GENTRANDOM, TOBRATROUGH, TOBRAPEAK, TOBRARND, AMIKACINPEAK, AMIKACINTROU, AMIKACIN,  in the last 72 hours   Microbiology: No results found for this or any previous visit (from the past 720 hour(s)).  Medical History: Past Medical History  Diagnosis Date  . Hypertension     Assessment: [age] yoM presenting with acute syncope, fever, tachycardia, and foul-smelling urine.  UA suggestive of UTI.  Pharmacy consulted to dose Ceftriaxone for UTI.  Ceftriaxone 2g IV x 1 given in ED.  Tmax: 103.2 WBC: elevated Renal: SCr 1.24 Blood and urine cultures ordered.  Goal of Therapy:  Eradication of infection Ceftriaxone requires no renal dose adjustment  Plan:  1.  Ceftriaxone 1g IV q24h. 2.  Pharmacy will sign off.  Clance Bollunyon, Berthold Glace 05/11/2014,8:38 PM

## 2014-05-11 NOTE — Progress Notes (Signed)
  CARE MANAGEMENT ED NOTE 05/11/2014  Patient:  Caryl ComesWHITE,Demitris K   Account Number:  [id number]  Date Initiated:  05/11/2014  Documentation initiated by:  Radford PaxFERRERO,Jiyan Walkowski  Subjective/Objective Assessment:   Patient presents to Ed with syncopal episode x2     Subjective/Objective Assessment Detail:     Action/Plan:   Action/Plan Detail:   Anticipated DC Date:       Status Recommendation to Physician:   Result of Recommendation:    Other ED Services  Consult Working Plan    DC Planning Services  Other  PCP issues    Choice offered to / List presented to:            Status of service:  Completed, signed off  ED Comments:   ED Comments Detail:  EDCM spoke to patient at bedside.  Patient confirms [facility]  System updated.

## 2014-05-11 NOTE — ED Notes (Signed)
Bed: WA06 Expected date:  Expected time:  Means of arrival:  Comments: EMS syncope/c.h.i.

## 2014-05-11 NOTE — Progress Notes (Signed)
Utilization Review completed.  Dandrea Widdowson RN CM  

## 2014-05-11 NOTE — ED Provider Notes (Signed)
Medical screening examination/treatment/procedure(s) were conducted as a shared visit with non-physician practitioner(s) and myself.  I personally evaluated the patient during the encounter.   EKG Interpretation   Date/Time:  Wednesday May 11 2014 18:00:39 EDT Ventricular Rate:  116 PR Interval:  145 QRS Duration: 92 QT Interval:  336 QTC Calculation: 467 R Axis:   75 Text Interpretation:  Sinus tachycardia Low voltage, extremity leads  Confirmed by Ziyad Dyar  MD-J, Jaylanni Eltringham ([id number]) on 05/11/2014 6:09:06 PM      Pt presented to the ED after 2 episodes of syncope.  In the ED noted to be febrile.   UA consistent with uti. CXR showed lymphadenopathy.  CT scans more suggestive of sarcoidosis over lymphoma.  Plan on admission , iv abx.  Further evaluation.  Linwood DibblesJon Brynn Reznik, MD 05/11/14 2123

## 2014-05-12 DIAGNOSIS — R509 Fever, unspecified: Secondary | ICD-10-CM

## 2014-05-12 DIAGNOSIS — R55 Syncope and collapse: Secondary | ICD-10-CM

## 2014-05-12 DIAGNOSIS — D862 Sarcoidosis of lung with sarcoidosis of lymph nodes: Secondary | ICD-10-CM

## 2014-05-12 DIAGNOSIS — R599 Enlarged lymph nodes, unspecified: Secondary | ICD-10-CM

## 2014-05-12 DIAGNOSIS — I10 Essential (primary) hypertension: Secondary | ICD-10-CM

## 2014-05-12 DIAGNOSIS — R591 Generalized enlarged lymph nodes: Secondary | ICD-10-CM

## 2014-05-12 HISTORY — PX: TRANSTHORACIC ECHOCARDIOGRAM: SHX275

## 2014-05-12 LAB — COMPREHENSIVE METABOLIC PANEL
ALBUMIN: 3 g/dL — AB (ref 3.5–5.2)
ALT: 29 U/L (ref 0–53)
AST: 27 U/L (ref 0–37)
Alkaline Phosphatase: 50 U/L (ref 39–117)
Anion gap: 11 (ref 5–15)
BILIRUBIN TOTAL: 0.5 mg/dL (ref 0.3–1.2)
BUN: 14 mg/dL (ref 6–23)
CHLORIDE: 103 meq/L (ref 96–112)
CO2: 22 mEq/L (ref 19–32)
Calcium: 8.7 mg/dL (ref 8.4–10.5)
Creatinine, Ser: 1.13 mg/dL (ref 0.50–1.35)
GFR calc Af Amer: 87 mL/min — ABNORMAL LOW (ref >90)
GFR calc non Af Amer: 75 mL/min — ABNORMAL LOW (ref >90)
Glucose, Bld: 94 mg/dL (ref 70–99)
Potassium: 4.2 mEq/L (ref 3.7–5.3)
SODIUM: 136 meq/L — AB (ref 137–147)
Total Protein: 7.3 g/dL (ref 6.0–8.3)

## 2014-05-12 LAB — GLUCOSE, CAPILLARY: Glucose-Capillary: 89 mg/dL (ref 70–99)

## 2014-05-12 LAB — CBC
HEMATOCRIT: 34.1 % — AB (ref 39.0–52.0)
Hemoglobin: 11.5 g/dL — ABNORMAL LOW (ref 13.0–17.0)
MCH: 29.1 pg (ref 26.0–34.0)
MCHC: 33.7 g/dL (ref 30.0–36.0)
MCV: 86.3 fL (ref 78.0–100.0)
Platelets: 243 10*3/uL (ref 150–400)
RBC: 3.95 MIL/uL — AB (ref 4.22–5.81)
RDW: 13.3 % (ref 11.5–15.5)
WBC: 13.9 10*3/uL — ABNORMAL HIGH (ref 4.0–10.5)

## 2014-05-12 LAB — HEMOGLOBIN A1C
Hgb A1c MFr Bld: 5.8 % — ABNORMAL HIGH (ref <5.7)
MEAN PLASMA GLUCOSE: 120 mg/dL — AB (ref <117)

## 2014-05-12 LAB — PROTIME-INR
INR: 1.19 (ref 0.00–1.49)
PROTHROMBIN TIME: 15.3 s — AB (ref 11.6–15.2)

## 2014-05-12 LAB — APTT: APTT: 42 s — AB (ref 24–37)

## 2014-05-12 LAB — TSH: TSH: 2.58 u[IU]/mL (ref 0.350–4.500)

## 2014-05-12 MED ORDER — HEPARIN SODIUM (PORCINE) 5000 UNIT/ML IJ SOLN
5000.0000 [IU] | Freq: Three times a day (TID) | INTRAMUSCULAR | Status: AC
  Filled 2014-05-12: qty 1

## 2014-05-12 MED ORDER — CEFUROXIME AXETIL 250 MG PO TABS
250.0000 mg | ORAL_TABLET | Freq: Two times a day (BID) | ORAL | Status: DC
  Administered 2014-05-12 – 2014-05-13 (×2): 250 mg via ORAL
  Filled 2014-05-12 (×4): qty 1

## 2014-05-12 NOTE — Consult Note (Addendum)
Name: Derek Bennett MRN: [id number] DOB: Sep 14, 1965    ADMISSION DATE:  05/11/2014 CONSULTATION DATE:  05/12/14  REFERRING MD :  [facility]  CHIEF COMPLAINT:  Chest lyphadenoapthy  BRIEF PATIENT DESCRIPTION: 48 yr old asmittted UTI, found with chest lymphadenoapthy  SIGNIFICANT EVENTS    STUDIES:  10/28 CT head>>>No acute intracranial injury or cervical spine fracture.2. Upper mediastinal and right supraclavicular lymphadenopathy Recommend enhanced chest CT. 10/29 CT chest >>>Extensive thoracic and upper abdominal adenopathy with peribronchovascular pulmonary micro nodularity. Favor sarcoidosis. Lymphoma is felt less likely. No acute superimposed process. The right lower lobe abnormality on chest radiograph likely represents an area of scarring versus inflammation related to presumed sarcoidosis 10/29 Echo- WNL  HISTORY OF PRESENT ILLNESS:  48 y.o. male presents with syncopal episode. He had no chest pain or sob associated. No loss of bowel or bladder control. Patient states that he has had sweating but no fevers. Found with sepsis related to UTI. Fluid resuscitation given. CT head neg but lymphadenoapthy noted that promted CT chest abdo that reveled sig adenopathy. Consult requested concern work up lymphadenopathy. Had lactic acidosis mild that cleared. Mild renal insuff that improved with volume.  PAST MEDICAL HISTORY :   has a past medical history of Hypertension.  has past surgical history that includes No past surgeries. Prior to Admission medications   Medication Sig Start Date End Date Taking? Authorizing Provider  atropine 1 % ophthalmic solution Place 1 drop into both eyes at bedtime.   Yes Historical Provider, MD  homatropine 5 % ophthalmic solution Place 1 drop into both eyes 4 (four) times daily.   Yes Historical Provider, MD  lisinopril-hydrochlorothiazide (PRINZIDE,ZESTORETIC) 20-12.5 MG per tablet Take 1 tablet by mouth daily.   Yes Historical Provider, MD   Allergies    Allergen Reactions  . Eggs Or Egg-Derived Products Shortness Of Breath    FAMILY HISTORY:  family history is not on file. SOCIAL HISTORY:  reports that he quit smoking about 25 years ago. His smoking use included Cigars. He has never used smokeless tobacco. He reports that he drinks alcohol. He reports that he does not use illicit drugs.  REVIEW OF SYSTEMS:   Constitutional: Negative for fever, chills, 70 pounds  weight loss in 12 months with exercise and diet changed, malaise/fatigue and  Did have diaphoresis.  HENT: Negative for hearing loss, ear pain, nosebleeds, congestion, sore throat, neck pain, tinnitus and ear discharge.   Eyes: Negative for blurred vision, double vision, photophobia, pain, discharge and redness.  Respiratory: Negative for cough, hemoptysis, sputum production, shortness of breath, wheezing and stridor.   Cardiovascular: Negative for chest pain, palpitations, orthopnea, claudication, leg swelling and PND.  Gastrointestinal: Negative for heartburn, nausea, vomiting, abdominal pain, diarrhea, constipation, blood in stool and melena.  Genitourinary: pos burning urination, first time he has had a ui Musculoskeletal: Negative for myalgias, back pain, joint pain and falls.  Skin: Negative for itching and rash.  Neurological: Negative for dizziness, tingling, tremors, sensory change, speech change, focal weakness, seizures,  Did have loss of consciousness, weakness and headaches.  Endo/Heme/Allergies: Negative for environmental allergies and polydipsia. Does not bruise/bleed easily.  SUBJECTIVE: no sob reported  VITAL SIGNS: Temp:  [98.3 F (36.8 C)-103.2 F (39.6 C)] 98.3 F (36.8 C) (10/29 1330) Pulse Rate:  [77-124] 77 (10/29 1330) Resp:  [15-23] 16 (10/29 1330) BP: (114-144)/(55-70) 130/70 mmHg (10/29 1330) SpO2:  [96 %-100 %] 100 % (10/29 1330) Weight:  [110.678 kg (244 lb)-111.403 kg (245 lb 9.6  oz)] 111.403 kg (245 lb 9.6 oz) (10/28 2249)  PHYSICAL  EXAMINATION: General:  Awake, muscular, no distress Neuro:  Awake, O x 4, CN intact HEENT: scant left fem lymphad, fullness rt scapular region but no defined nodes i could palpate, left WNL, neck wnl Cardiovascular:  s1 s2 RRR distant Lungs:  CTA Abdomen:  Soft, BS wnl, no palp liver, spleen, no r Musculoskeletal: no edema Skin:  No rash   Recent Labs Lab 05/11/14 1806 05/12/14 0510  NA 132* 136*  K 4.3 4.2  CL 98 103  CO2 20 22  BUN 15 14  CREATININE 1.24 1.13  GLUCOSE 104* 94    Recent Labs Lab 05/11/14 1806 05/12/14 0510  HGB 12.6* 11.5*  HCT 35.5* 34.1*  WBC 14.9* 13.9*  PLT 256 243   Dg Chest 2 View  05/11/2014   CLINICAL DATA:  Syncope.  R55.  EXAM: CHEST  2 VIEW  COMPARISON:  08/16/2007 chest radiograph and cervical spine CT of earlier today  FINDINGS: Midline trachea. Normal heart size. Extensive mediastinal and bilateral hilar adenopathy. No pleural effusion or pneumothorax. Pulmonary interstitial prominence is new since the prior radiograph. Cannot exclude nodule projecting over the right costophrenic angle on the frontal film. 1.3 cm. Not well localized on the lateral.  IMPRESSION: Extensive mediastinal and bilateral hilar adenopathy. Concurrent pulmonary interstitial thickening. Considerations include sarcoidosis, lymphoproliferative disorder such as lymphoma, or small cell lung carcinoma. Further evaluation with contrast-enhanced chest CT should be considered. These results were called by telephone at the time of interpretation on 05/11/2014 at 6:28 pm to Dr. Dierdre ForthHANNAH MUTHERSBAUGH , who verbally acknowledged these results.  Possible right lung base nodule laterally. This would also be better evaluated with chest CT.   Electronically Signed   By: Jeronimo GreavesKyle  Talbot M.D.   On: 05/11/2014 18:31   Ct Head Wo Contrast  05/11/2014   CLINICAL DATA:  Two syncopal episodes.  Fall.  Scalp contusion.  EXAM: CT HEAD WITHOUT CONTRAST  CT CERVICAL SPINE WITHOUT CONTRAST  TECHNIQUE:  Multidetector CT imaging of the head and cervical spine was performed following the standard protocol without intravenous contrast. Multiplanar CT image reconstructions of the cervical spine were also generated.  COMPARISON:  None.  FINDINGS: CT HEAD FINDINGS  Skull and Sinuses:Negative for acute fracture or destructive process. Deformity of the nasal arch appears chronic. No effusion in the imaged paranasal sinuses. Mastoids and middle ears are clear.  Orbits: No acute abnormality.  Brain: No evidence of acute abnormality, such as acute infarction, hemorrhage, hydrocephalus, or mass lesion/mass effect.  CT CERVICAL SPINE FINDINGS  Partially visualized adenopathy in the upper mediastinum. A right upper paratracheal node measures at least 13 mm in short axis. Right supraclavicular lymphadenopathy with a node measuring 2 cm in short axis.  Negative for acute fracture. No traumatic malalignment. Cervical spondylosis with small central disc herniations, most notable at C3-4 and C4-5. These likely contact the ventral cord. No gross cervical canal hematoma or prevertebral swelling.  IMPRESSION: 1. No acute intracranial injury or cervical spine fracture. 2. Upper mediastinal and right supraclavicular lymphadenopathy. Recommend enhanced chest CT.   Electronically Signed   By: Tiburcio PeaJonathan  Watts M.D.   On: 05/11/2014 18:20   Ct Chest W Contrast  05/11/2014   CLINICAL DATA:  Thoracic adenopathy on chest radiograph.Lymphadenopathy R59.1 (ICD-10-CM)  EXAM: CT CHEST, ABDOMEN, AND PELVIS WITH CONTRAST  TECHNIQUE: Multidetector CT imaging of the chest, abdomen and pelvis was performed following the standard protocol during bolus administration of intravenous  contrast.  CONTRAST:  OMNIPAQUE IOHEXOL 300 MG/ML  SOLN  COMPARISON:  Chest radiograph of same date and back to 08/16/2007  FINDINGS: CT CHEST FINDINGS  Lungs/Pleura: Probable scarring in the right lower lobe laterally. This corresponds to the plain film abnormality.  Example image 53 of series 7.  Bronchovascular nodularity which is bilateral and slightly upper lobe predominant.  No pleural fluid.  Heart/Mediastinum: Right supraclavicular node measures 1.2 cm on image 4.  No axillary adenopathy. Heart size upper normal, without pericardial effusion. LAD coronary artery atherosclerosis on image 29.  Extensive thoracic adenopathy. Right paratracheal nodal conglomerate measures 4.1 x 2.6 cm on image 21.  Subcarinal nodal mass measures 4.6 x 8.5 cm on image 32.  Right hilar node measures 2.5 cm on image 28. Left infrahilar adenopathy.  Index prevascular node measures 2.6 cm on image 20.  CT ABDOMEN AND PELVIS FINDINGS  Hepatobiliary: Too small to characterize right hepatic lobe lesion at 3 mm. Normal gallbladder, without biliary ductal dilatation.  Pancreas: Normal, without mass or pancreatic ductal dilatation.  Spleen: Normal, without splenomegaly.  Urinary tract: Normal adrenal glands. Interpolar right renal lesion which is too small to characterize. Normal ureters and urinary bladder.  Stomach/Bowel: Normal stomach, without wall thickening. Proximal duodenum is displaced by adenopathy, but nonobstructed. Normal colon, appendix, and terminal ileum. Normal small bowel, without mesenteric adenopathy.  Vascular/Lymphatic: Mild atherosclerosis. Marked upper abdominal adenopathy. Gastrohepatic ligament node measures 3.7 x 5.0 cm on image 60  Portacaval node which measures 4.6 x 5.6 cm on image 70. No pelvic adenopathy.  Reproductive: Normal prostate.  Other:  No significant free fluid.  Fat containing umbilical hernia.  Musculoskeletal: No acute osseous abnormality.  IMPRESSION: 1. Extensive thoracic and upper abdominal adenopathy with peribronchovascular pulmonary micro nodularity. Favor sarcoidosis. Lymphoma is felt less likely. 2. No acute superimposed process. The right lower lobe abnormality on chest radiograph likely represents an area of scarring versus inflammation related to  presumed sarcoidosis. 3. Age advanced coronary artery atherosclerosis. Recommend assessment of coronary risk factors and consideration of medical therapy.   Electronically Signed   By: Jeronimo Greaves M.D.   On: 05/11/2014 19:51   Ct Cervical Spine Wo Contrast  05/11/2014   CLINICAL DATA:  Two syncopal episodes.  Fall.  Scalp contusion.  EXAM: CT HEAD WITHOUT CONTRAST  CT CERVICAL SPINE WITHOUT CONTRAST  TECHNIQUE: Multidetector CT imaging of the head and cervical spine was performed following the standard protocol without intravenous contrast. Multiplanar CT image reconstructions of the cervical spine were also generated.  COMPARISON:  None.  FINDINGS: CT HEAD FINDINGS  Skull and Sinuses:Negative for acute fracture or destructive process. Deformity of the nasal arch appears chronic. No effusion in the imaged paranasal sinuses. Mastoids and middle ears are clear.  Orbits: No acute abnormality.  Brain: No evidence of acute abnormality, such as acute infarction, hemorrhage, hydrocephalus, or mass lesion/mass effect.  CT CERVICAL SPINE FINDINGS  Partially visualized adenopathy in the upper mediastinum. A right upper paratracheal node measures at least 13 mm in short axis. Right supraclavicular lymphadenopathy with a node measuring 2 cm in short axis.  Negative for acute fracture. No traumatic malalignment. Cervical spondylosis with small central disc herniations, most notable at C3-4 and C4-5. These likely contact the ventral cord. No gross cervical canal hematoma or prevertebral swelling.  IMPRESSION: 1. No acute intracranial injury or cervical spine fracture. 2. Upper mediastinal and right supraclavicular lymphadenopathy. Recommend enhanced chest CT.   Electronically Signed   By: Christiane Ha  Watts M.D.   On: 05/11/2014 18:20   Ct Abdomen Pelvis W Contrast  05/11/2014   CLINICAL DATA:  Thoracic adenopathy on chest radiograph.Lymphadenopathy R59.1 (ICD-10-CM)  EXAM: CT CHEST, ABDOMEN, AND PELVIS WITH CONTRAST   TECHNIQUE: Multidetector CT imaging of the chest, abdomen and pelvis was performed following the standard protocol during bolus administration of intravenous contrast.  CONTRAST:  100mL OMNIPAQUE IOHEXOL 300 MG/ML  SOLN  COMPARISON:  Chest radiograph of same date and back to 08/16/2007  FINDINGS: CT CHEST FINDINGS  Lungs/Pleura: Probable scarring in the right lower lobe laterally. This corresponds to the plain film abnormality. Example image 53 of series 7.  Bronchovascular nodularity which is bilateral and slightly upper lobe predominant.  No pleural fluid.  Heart/Mediastinum: Right supraclavicular node measures 1.2 cm on image 4.  No axillary adenopathy. Heart size upper normal, without pericardial effusion. LAD coronary artery atherosclerosis on image 29.  Extensive thoracic adenopathy. Right paratracheal nodal conglomerate measures 4.1 x 2.6 cm on image 21.  Subcarinal nodal mass measures 4.6 x 8.5 cm on image 32.  Right hilar node measures 2.5 cm on image 28. Left infrahilar adenopathy.  Index prevascular node measures 2.6 cm on image 20.  CT ABDOMEN AND PELVIS FINDINGS  Hepatobiliary: Too small to characterize right hepatic lobe lesion at 3 mm. Normal gallbladder, without biliary ductal dilatation.  Pancreas: Normal, without mass or pancreatic ductal dilatation.  Spleen: Normal, without splenomegaly.  Urinary tract: Normal adrenal glands. Interpolar right renal lesion which is too small to characterize. Normal ureters and urinary bladder.  Stomach/Bowel: Normal stomach, without wall thickening. Proximal duodenum is displaced by adenopathy, but nonobstructed. Normal colon, appendix, and terminal ileum. Normal small bowel, without mesenteric adenopathy.  Vascular/Lymphatic: Mild atherosclerosis. Marked upper abdominal adenopathy. Gastrohepatic ligament node measures 3.7 x 5.0 cm on image 60  Portacaval node which measures 4.6 x 5.6 cm on image 70. No pelvic adenopathy.  Reproductive: Normal prostate.  Other:  No  significant free fluid.  Fat containing umbilical hernia.  Musculoskeletal: No acute osseous abnormality.  IMPRESSION: 1. Extensive thoracic and upper abdominal adenopathy with peribronchovascular pulmonary micro nodularity. Favor sarcoidosis. Lymphoma is felt less likely. 2. No acute superimposed process. The right lower lobe abnormality on chest radiograph likely represents an area of scarring versus inflammation related to presumed sarcoidosis. 3. Age advanced coronary artery atherosclerosis. Recommend assessment of coronary risk factors and consideration of medical therapy.   Electronically Signed   By: Jeronimo GreavesKyle  Talbot M.D.   On: 05/11/2014 19:51    ASSESSMENT / PLAN:  A. Significant hilar and upper abdominal lymphadenopathy: r/o Sarcoid, lymphoma, r/o HIV Sepsis from UTI resolving Syncope presumed from sepsis, r/o other cardiac etiology  Would have US evaluation of rt perclavicular lymphad node noted and BX if able IF unable to BX or non diagnostic - would proceed to EBUS bronch bx as outpt with PCCM lebaure We spoke about the risks and benefits to EBUS Other options could include Endoscopic US and bx, but would proceed first with EBUS His work up can occur as outpt Send HIV in am, he has no risks factors Consider vdrl We will follow in am and assess if BX was done and plan EBUS if needed in next week No role steroids now or ACEI level Echo reassuring  Mcarthur Rossettianiel J. Tyson AliasFeinstein, MD, FACP Pgr: [phone] [facility]  Pulmonary and Critical Care Medicine [facility] Pager: [phone]  05/12/2014, 4:01 PM

## 2014-05-12 NOTE — Consult Note (Signed)
Chief Complaint: Chief Complaint  Patient presents with  . Loss of Consciousness  UTI Sepsis Lymphadenopathy  Referring Physician(s): Dr Rhona Leavenshiu  History of Present Illness: Derek Bennett is a 48 y.o. male  Pt was admitted with UTI/sepsis Much better with antibiotic tx Work up reveals abnormal CXR--possible sarcoidosis per interpretation Mediastinal adenopathy Dr Rhona Leavenshiu has requested consult IR for biopsy Dr Archer AsaMcCullough has reviewed imaging and chart Feels pt is candidate for Rt supraclavicular lymph node bx Now scheduled for same 10/30 Hep inj held   Past Medical History  Diagnosis Date  . Hypertension     Past Surgical History  Procedure Laterality Date  . No past surgeries      Allergies: Eggs or egg-derived products  Medications: Prior to Admission medications   Medication Sig Start Date End Date Taking? Authorizing Provider  atropine 1 % ophthalmic solution Place 1 drop into both eyes at bedtime.   Yes Historical Provider, MD  homatropine 5 % ophthalmic solution Place 1 drop into both eyes 4 (four) times daily.   Yes Historical Provider, MD  lisinopril-hydrochlorothiazide (PRINZIDE,ZESTORETIC) 20-12.5 MG per tablet Take 1 tablet by mouth daily.   Yes Historical Provider, MD    History reviewed. No pertinent family history.  History   Social History  . Marital Status: Married    Spouse Name: N/A    Number of Children: N/A  . Years of Education: N/A   Social History Main Topics  . Smoking status: Former Smoker    Types: Cigars    Quit date: 05/11/1989  . Smokeless tobacco: Never Used  . Alcohol Use: Yes     Comment: occasional  . Drug Use: No  . Sexual Activity: None   Other Topics Concern  . None   Social History Narrative  . None    Review of Systems: A 12 point ROS discussed and pertinent positives are indicated in the HPI above.  All other systems are negative.  Review of Systems  Constitutional: Negative for fever, activity change,  appetite change and fatigue.       Intentional wt loss  Respiratory: Negative for chest tightness and shortness of breath.   Cardiovascular: Negative for chest pain.  Gastrointestinal: Negative for abdominal pain.  Genitourinary: Negative for difficulty urinating.  Musculoskeletal: Negative for back pain.  Neurological: Negative for weakness.  Psychiatric/Behavioral: Negative for behavioral problems and confusion.    Vital Signs: BP 130/70  Pulse 77  Temp(Src) 98.3 F (36.8 C) (Oral)  Resp 16  Ht 5\' 11"  (1.803 m)  Wt 111.403 kg (245 lb 9.6 oz)  BMI 34.27 kg/m2  SpO2 100%  Physical Exam  Constitutional: He is oriented to person, place, and time. He appears well-nourished.  Cardiovascular: Normal rate, regular rhythm and normal heart sounds.   No murmur heard. Pulmonary/Chest: Effort normal and breath sounds normal. He has no wheezes.  Abdominal: Soft. Bowel sounds are normal. There is no tenderness.  Musculoskeletal: Normal range of motion.  Neurological: He is alert and oriented to person, place, and time.  Skin: Skin is warm and dry.  Psychiatric: He has a normal mood and affect. His behavior is normal. Judgment and thought content normal.    Imaging: Dg Chest 2 View  05/11/2014   CLINICAL DATA:  Syncope.  R55.  EXAM: CHEST  2 VIEW  COMPARISON:  08/16/2007 chest radiograph and cervical spine CT of earlier today  FINDINGS: Midline trachea. Normal heart size. Extensive mediastinal and bilateral hilar adenopathy. No pleural effusion  or pneumothorax. Pulmonary interstitial prominence is new since the prior radiograph. Cannot exclude nodule projecting over the right costophrenic angle on the frontal film. 1.3 cm. Not well localized on the lateral.  IMPRESSION: Extensive mediastinal and bilateral hilar adenopathy. Concurrent pulmonary interstitial thickening. Considerations include sarcoidosis, lymphoproliferative disorder such as lymphoma, or small cell lung carcinoma. Further  evaluation with contrast-enhanced chest CT should be considered. These results were called by telephone at the time of interpretation on 05/11/2014 at 6:28 pm to Dr. Dierdre ForthHANNAH MUTHERSBAUGH , who verbally acknowledged these results.  Possible right lung base nodule laterally. This would also be better evaluated with chest CT.   Electronically Signed   By: Jeronimo GreavesKyle  Talbot M.D.   On: 05/11/2014 18:31   Ct Head Wo Contrast  05/11/2014   CLINICAL DATA:  Two syncopal episodes.  Fall.  Scalp contusion.  EXAM: CT HEAD WITHOUT CONTRAST  CT CERVICAL SPINE WITHOUT CONTRAST  TECHNIQUE: Multidetector CT imaging of the head and cervical spine was performed following the standard protocol without intravenous contrast. Multiplanar CT image reconstructions of the cervical spine were also generated.  COMPARISON:  None.  FINDINGS: CT HEAD FINDINGS  Skull and Sinuses:Negative for acute fracture or destructive process. Deformity of the nasal arch appears chronic. No effusion in the imaged paranasal sinuses. Mastoids and middle ears are clear.  Orbits: No acute abnormality.  Brain: No evidence of acute abnormality, such as acute infarction, hemorrhage, hydrocephalus, or mass lesion/mass effect.  CT CERVICAL SPINE FINDINGS  Partially visualized adenopathy in the upper mediastinum. A right upper paratracheal node measures at least 13 mm in short axis. Right supraclavicular lymphadenopathy with a node measuring 2 cm in short axis.  Negative for acute fracture. No traumatic malalignment. Cervical spondylosis with small central disc herniations, most notable at C3-4 and C4-5. These likely contact the ventral cord. No gross cervical canal hematoma or prevertebral swelling.  IMPRESSION: 1. No acute intracranial injury or cervical spine fracture. 2. Upper mediastinal and right supraclavicular lymphadenopathy. Recommend enhanced chest CT.   Electronically Signed   By: Tiburcio PeaJonathan  Watts M.D.   On: 05/11/2014 18:20   Ct Chest W Contrast  05/11/2014    CLINICAL DATA:  Thoracic adenopathy on chest radiograph.Lymphadenopathy R59.1 (ICD-10-CM)  EXAM: CT CHEST, ABDOMEN, AND PELVIS WITH CONTRAST  TECHNIQUE: Multidetector CT imaging of the chest, abdomen and pelvis was performed following the standard protocol during bolus administration of intravenous contrast.  CONTRAST:  100mL OMNIPAQUE IOHEXOL 300 MG/ML  SOLN  COMPARISON:  Chest radiograph of same date and back to 08/16/2007  FINDINGS: CT CHEST FINDINGS  Lungs/Pleura: Probable scarring in the right lower lobe laterally. This corresponds to the plain film abnormality. Example image 53 of series 7.  Bronchovascular nodularity which is bilateral and slightly upper lobe predominant.  No pleural fluid.  Heart/Mediastinum: Right supraclavicular node measures 1.2 cm on image 4.  No axillary adenopathy. Heart size upper normal, without pericardial effusion. LAD coronary artery atherosclerosis on image 29.  Extensive thoracic adenopathy. Right paratracheal nodal conglomerate measures 4.1 x 2.6 cm on image 21.  Subcarinal nodal mass measures 4.6 x 8.5 cm on image 32.  Right hilar node measures 2.5 cm on image 28. Left infrahilar adenopathy.  Index prevascular node measures 2.6 cm on image 20.  CT ABDOMEN AND PELVIS FINDINGS  Hepatobiliary: Too small to characterize right hepatic lobe lesion at 3 mm. Normal gallbladder, without biliary ductal dilatation.  Pancreas: Normal, without mass or pancreatic ductal dilatation.  Spleen: Normal, without splenomegaly.  Urinary  tract: Normal adrenal glands. Interpolar right renal lesion which is too small to characterize. Normal ureters and urinary bladder.  Stomach/Bowel: Normal stomach, without wall thickening. Proximal duodenum is displaced by adenopathy, but nonobstructed. Normal colon, appendix, and terminal ileum. Normal small bowel, without mesenteric adenopathy.  Vascular/Lymphatic: Mild atherosclerosis. Marked upper abdominal adenopathy. Gastrohepatic ligament node measures 3.7  x 5.0 cm on image 60  Portacaval node which measures 4.6 x 5.6 cm on image 70. No pelvic adenopathy.  Reproductive: Normal prostate.  Other:  No significant free fluid.  Fat containing umbilical hernia.  Musculoskeletal: No acute osseous abnormality.  IMPRESSION: 1. Extensive thoracic and upper abdominal adenopathy with peribronchovascular pulmonary micro nodularity. Favor sarcoidosis. Lymphoma is felt less likely. 2. No acute superimposed process. The right lower lobe abnormality on chest radiograph likely represents an area of scarring versus inflammation related to presumed sarcoidosis. 3. Age advanced coronary artery atherosclerosis. Recommend assessment of coronary risk factors and consideration of medical therapy.   Electronically Signed   By: Jeronimo Greaves M.D.   On: 05/11/2014 19:51   Ct Cervical Spine Wo Contrast  05/11/2014   CLINICAL DATA:  Two syncopal episodes.  Fall.  Scalp contusion.  EXAM: CT HEAD WITHOUT CONTRAST  CT CERVICAL SPINE WITHOUT CONTRAST  TECHNIQUE: Multidetector CT imaging of the head and cervical spine was performed following the standard protocol without intravenous contrast. Multiplanar CT image reconstructions of the cervical spine were also generated.  COMPARISON:  None.  FINDINGS: CT HEAD FINDINGS  Skull and Sinuses:Negative for acute fracture or destructive process. Deformity of the nasal arch appears chronic. No effusion in the imaged paranasal sinuses. Mastoids and middle ears are clear.  Orbits: No acute abnormality.  Brain: No evidence of acute abnormality, such as acute infarction, hemorrhage, hydrocephalus, or mass lesion/mass effect.  CT CERVICAL SPINE FINDINGS  Partially visualized adenopathy in the upper mediastinum. A right upper paratracheal node measures at least 13 mm in short axis. Right supraclavicular lymphadenopathy with a node measuring 2 cm in short axis.  Negative for acute fracture. No traumatic malalignment. Cervical spondylosis with small central disc  herniations, most notable at C3-4 and C4-5. These likely contact the ventral cord. No gross cervical canal hematoma or prevertebral swelling.  IMPRESSION: 1. No acute intracranial injury or cervical spine fracture. 2. Upper mediastinal and right supraclavicular lymphadenopathy. Recommend enhanced chest CT.   Electronically Signed   By: Tiburcio Pea M.D.   On: 05/11/2014 18:20   Ct Abdomen Pelvis W Contrast  05/11/2014   CLINICAL DATA:  Thoracic adenopathy on chest radiograph.Lymphadenopathy R59.1 (ICD-10-CM)  EXAM: CT CHEST, ABDOMEN, AND PELVIS WITH CONTRAST  TECHNIQUE: Multidetector CT imaging of the chest, abdomen and pelvis was performed following the standard protocol during bolus administration of intravenous contrast.  CONTRAST:  OMNIPAQUE IOHEXOL 300 MG/ML  SOLN  COMPARISON:  Chest radiograph of same date and back to 08/16/2007  FINDINGS: CT CHEST FINDINGS  Lungs/Pleura: Probable scarring in the right lower lobe laterally. This corresponds to the plain film abnormality. Example image 53 of series 7.  Bronchovascular nodularity which is bilateral and slightly upper lobe predominant.  No pleural fluid.  Heart/Mediastinum: Right supraclavicular node measures 1.2 cm on image 4.  No axillary adenopathy. Heart size upper normal, without pericardial effusion. LAD coronary artery atherosclerosis on image 29.  Extensive thoracic adenopathy. Right paratracheal nodal conglomerate measures 4.1 x 2.6 cm on image 21.  Subcarinal nodal mass measures 4.6 x 8.5 cm on image 32.  Right hilar node measures  2.5 cm on image 28. Left infrahilar adenopathy.  Index prevascular node measures 2.6 cm on image 20.  CT ABDOMEN AND PELVIS FINDINGS  Hepatobiliary: Too small to characterize right hepatic lobe lesion at 3 mm. Normal gallbladder, without biliary ductal dilatation.  Pancreas: Normal, without mass or pancreatic ductal dilatation.  Spleen: Normal, without splenomegaly.  Urinary tract: Normal adrenal glands.  Interpolar right renal lesion which is too small to characterize. Normal ureters and urinary bladder.  Stomach/Bowel: Normal stomach, without wall thickening. Proximal duodenum is displaced by adenopathy, but nonobstructed. Normal colon, appendix, and terminal ileum. Normal small bowel, without mesenteric adenopathy.  Vascular/Lymphatic: Mild atherosclerosis. Marked upper abdominal adenopathy. Gastrohepatic ligament node measures 3.7 x 5.0 cm on image 60  Portacaval node which measures 4.6 x 5.6 cm on image 70. No pelvic adenopathy.  Reproductive: Normal prostate.  Other:  No significant free fluid.  Fat containing umbilical hernia.  Musculoskeletal: No acute osseous abnormality.  IMPRESSION: 1. Extensive thoracic and upper abdominal adenopathy with peribronchovascular pulmonary micro nodularity. Favor sarcoidosis. Lymphoma is felt less likely. 2. No acute superimposed process. The right lower lobe abnormality on chest radiograph likely represents an area of scarring versus inflammation related to presumed sarcoidosis. 3. Age advanced coronary artery atherosclerosis. Recommend assessment of coronary risk factors and consideration of medical therapy.   Electronically Signed   By: Jeronimo Greaves M.D.   On: 05/11/2014 19:51    Labs:  CBC:  Recent Labs  05/11/14 1806 05/12/14 0510  WBC 14.9* 13.9*  HGB 12.6* 11.5*  HCT 35.5* 34.1*  PLT 256 243    COAGS: No results found for this basename: INR, APTT,  in the last 8760 hours  BMP:  Recent Labs  05/11/14 1806 05/12/14 0510  NA 132* 136*  K 4.3 4.2  CL 98 103  CO2 20 22  GLUCOSE 104* 94  BUN 15 14  CALCIUM 9.0 8.7  CREATININE 1.24 1.13  GFRNONAA 67* 75*  GFRAA 77* 87*    LIVER FUNCTION TESTS:  Recent Labs  05/11/14 1806 05/12/14 0510  BILITOT 0.5 0.5  AST 31 27  ALT 34 29  ALKPHOS 56 50  PROT 8.0 7.3  ALBUMIN 3.4* 3.0*    TUMOR MARKERS: No results found for this basename: AFPTM, CEA, CA199, CHROMGRNA,  in the last 8760  hours  Assessment and Plan:  Abnormal CXR Possible sarcoidosis Mediastinal LAN Worrisome for Lymphoma Scheduled now for R [place] LN bx Pt aware of procedure benefits and risks and agreeable to proceed Consent signed andin chart   Thank you for this interesting consult.  I greatly enjoyed meeting JC VERON and look forward to participating in their care.    I spent a total of 20 minutes face to face in clinical consultation, greater than 50% of which was counseling/coordinating care for R supraclavicular lymph node bx  Signed: Taniya Dasher A 05/12/2014, 4:06 PM

## 2014-05-12 NOTE — Progress Notes (Signed)
[facility] PROGRESS NOTE  Derek ComesBryant K Bennett ZOX:[id number] DOB: 05/23/1966 DOA: 05/11/2014 PCP: Mickie HillierLITTLE,KEVIN LORNE, MD  Assessment/Plan: 1. Sepsis with UTI -feels much improved with rocephin  -WBC trending down -cultures have been ordered - blood cx neg thus far, urine cx pending -will transition to PO ceftin 2.   Hypertension -home medications were placed on hold on admit 4. Mediastinal Adenopathy -likely sarcoid per radiologist interpretation -No evidence of adenopathy on 2009 CXR -Pt denies night sweats, reports intentional 70lb wt loss over the past year through diet and exercise 5. Syncope  -likely related to UTI and sepsis  -echo results pending -carotid dopplers without stenosis   Code Status: Full Family Communication: Pt in room (indicate person spoken with, relationship, and if by phone, the number) Disposition Plan: Pending   Consultants:    Procedures:    Antibiotics:  Rocephin 10/28>>>10/29  Ceftin 10/29>>>  HPI/Subjective: Feels much better today. No acute events noted  Objective: Filed Vitals:   05/11/14 2026 05/11/14 2100 05/11/14 2249 05/12/14 0554  BP: 144/55 114/68 136/60 128/59  Pulse:  95 98 91  Temp: 99.4 F (37.4 C)  98.4 F (36.9 C) 98.5 F (36.9 C)  TempSrc: Oral  Oral Oral  Resp:  15 16 16   Height:   5\' 11"  (1.803 m)   Weight:   111.403 kg (245 lb 9.6 oz)   SpO2:  99% 99% 99%    Intake/Output Summary (Last 24 hours) at 05/12/14 1241 Last data filed at 05/12/14 0535  Gross per 24 hour  Intake 1982.4 ml  Output    901 ml  Net 1081.4 ml   Filed Weights   05/11/14 1817 05/11/14 2249  Weight: 110.678 kg (244 lb) 111.403 kg (245 lb 9.6 oz)    Exam:   General:  Awake, in nad  Cardiovascular: regular, s1, s2  Respiratory: normal resp effort, no wheezing  Abdomen: soft, nondistended  Musculoskeletal: perfused, no clubbing  Data Reviewed: Basic Metabolic Panel:  Recent Labs Lab 05/11/14 1806 05/12/14 0510   NA 132* 136*  K 4.3 4.2  CL 98 103  CO2 20 22  GLUCOSE 104* 94  BUN 15 14  CREATININE 1.24 1.13  CALCIUM 9.0 8.7   Liver Function Tests:  Recent Labs Lab 05/11/14 1806 05/12/14 0510  AST 31 27  ALT 34 29  ALKPHOS 56 50  BILITOT 0.5 0.5  PROT 8.0 7.3  ALBUMIN 3.4* 3.0*   No results found for this basename: LIPASE, AMYLASE,  in the last 168 hours No results found for this basename: AMMONIA,  in the last 168 hours CBC:  Recent Labs Lab 05/11/14 1806 05/12/14 0510  WBC 14.9* 13.9*  NEUTROABS 11.2*  --   HGB 12.6* 11.5*  HCT 35.5* 34.1*  MCV 84.3 86.3  PLT 256 243   Cardiac Enzymes:  Recent Labs Lab 05/11/14 1806  TROPONINI <0.30   BNP (last 3 results) No results found for this basename: PROBNP,  in the last 8760 hours CBG:  Recent Labs Lab 05/12/14 0758  GLUCAP 89    Recent Results (from the past 240 hour(s))  CULTURE, BLOOD (ROUTINE X 2)     Status: None   Collection Time    05/11/14  6:27 PM      Result Value Ref Range Status   Specimen Description BLOOD LEFT ANTECUBITAL   Final   Special Requests BOTTLES DRAWN AEROBIC AND ANAEROBIC 5CC   Final   Culture  Setup Time     Final  Value: 05/11/2014 22:46     Performed at [facility]   Culture     Final   Value:        BLOOD CULTURE RECEIVED NO GROWTH TO DATE CULTURE WILL BE HELD FOR 5 DAYS BEFORE ISSUING A FINAL NEGATIVE REPORT     Performed at [facility]   Report Status PENDING   Incomplete  CULTURE, BLOOD (ROUTINE X 2)     Status: None   Collection Time    05/11/14  6:49 PM      Result Value Ref Range Status   Specimen Description BLOOD RIGHT ANTECUBITAL   Final   Special Requests BOTTLES DRAWN AEROBIC AND ANAEROBIC   Final   Culture  Setup Time     Final   Value: 05/11/2014 22:40     Performed at [facility]   Culture     Final   Value:        BLOOD CULTURE RECEIVED NO GROWTH TO DATE CULTURE WILL BE HELD FOR 5 DAYS BEFORE ISSUING A FINAL NEGATIVE REPORT      Performed at [facility]   Report Status PENDING   Incomplete     Studies: Dg Chest 2 View  05/11/2014   CLINICAL DATA:  Syncope.  R55.  EXAM: CHEST  2 VIEW  COMPARISON:  08/16/2007 chest radiograph and cervical spine CT of earlier today  FINDINGS: Midline trachea. Normal heart size. Extensive mediastinal and bilateral hilar adenopathy. No pleural effusion or pneumothorax. Pulmonary interstitial prominence is new since the prior radiograph. Cannot exclude nodule projecting over the right costophrenic angle on the frontal film. 1.3 cm. Not well localized on the lateral.  IMPRESSION: Extensive mediastinal and bilateral hilar adenopathy. Concurrent pulmonary interstitial thickening. Considerations include sarcoidosis, lymphoproliferative disorder such as lymphoma, or small cell lung carcinoma. Further evaluation with contrast-enhanced chest CT should be considered. These results were called by telephone at the time of interpretation on 05/11/2014 at 6:28 pm to Dr. Dierdre Forth , who verbally acknowledged these results.  Possible right lung base nodule laterally. This would also be better evaluated with chest CT.   Electronically Signed   By: Jeronimo Greaves M.D.   On: 05/11/2014 18:31   Ct Head Wo Contrast  05/11/2014   CLINICAL DATA:  Two syncopal episodes.  Fall.  Scalp contusion.  EXAM: CT HEAD WITHOUT CONTRAST  CT CERVICAL SPINE WITHOUT CONTRAST  TECHNIQUE: Multidetector CT imaging of the head and cervical spine was performed following the standard protocol without intravenous contrast. Multiplanar CT image reconstructions of the cervical spine were also generated.  COMPARISON:  None.  FINDINGS: CT HEAD FINDINGS  Skull and Sinuses:Negative for acute fracture or destructive process. Deformity of the nasal arch appears chronic. No effusion in the imaged paranasal sinuses. Mastoids and middle ears are clear.  Orbits: No acute abnormality.  Brain: No evidence of acute abnormality, such as acute  infarction, hemorrhage, hydrocephalus, or mass lesion/mass effect.  CT CERVICAL SPINE FINDINGS  Partially visualized adenopathy in the upper mediastinum. A right upper paratracheal node measures at least 13 mm in short axis. Right supraclavicular lymphadenopathy with a node measuring 2 cm in short axis.  Negative for acute fracture. No traumatic malalignment. Cervical spondylosis with small central disc herniations, most notable at C3-4 and C4-5. These likely contact the ventral cord. No gross cervical canal hematoma or prevertebral swelling.  IMPRESSION: 1. No acute intracranial injury or cervical spine fracture. 2. Upper mediastinal and right supraclavicular lymphadenopathy. Recommend  enhanced chest CT.   Electronically Signed   By: Tiburcio Pea M.D.   On: 05/11/2014 18:20   Ct Chest W Contrast  05/11/2014   CLINICAL DATA:  Thoracic adenopathy on chest radiograph.Lymphadenopathy R59.1 (ICD-10-CM)  EXAM: CT CHEST, ABDOMEN, AND PELVIS WITH CONTRAST  TECHNIQUE: Multidetector CT imaging of the chest, abdomen and pelvis was performed following the standard protocol during bolus administration of intravenous contrast.  CONTRAST:  OMNIPAQUE IOHEXOL 300 MG/ML  SOLN  COMPARISON:  Chest radiograph of same date and back to 08/16/2007  FINDINGS: CT CHEST FINDINGS  Lungs/Pleura: Probable scarring in the right lower lobe laterally. This corresponds to the plain film abnormality. Example image 53 of series 7.  Bronchovascular nodularity which is bilateral and slightly upper lobe predominant.  No pleural fluid.  Heart/Mediastinum: Right supraclavicular node measures 1.2 cm on image 4.  No axillary adenopathy. Heart size upper normal, without pericardial effusion. LAD coronary artery atherosclerosis on image 29.  Extensive thoracic adenopathy. Right paratracheal nodal conglomerate measures 4.1 x 2.6 cm on image 21.  Subcarinal nodal mass measures 4.6 x 8.5 cm on image 32.  Right hilar node measures 2.5 cm on image 28.  Left infrahilar adenopathy.  Index prevascular node measures 2.6 cm on image 20.  CT ABDOMEN AND PELVIS FINDINGS  Hepatobiliary: Too small to characterize right hepatic lobe lesion at 3 mm. Normal gallbladder, without biliary ductal dilatation.  Pancreas: Normal, without mass or pancreatic ductal dilatation.  Spleen: Normal, without splenomegaly.  Urinary tract: Normal adrenal glands. Interpolar right renal lesion which is too small to characterize. Normal ureters and urinary bladder.  Stomach/Bowel: Normal stomach, without wall thickening. Proximal duodenum is displaced by adenopathy, but nonobstructed. Normal colon, appendix, and terminal ileum. Normal small bowel, without mesenteric adenopathy.  Vascular/Lymphatic: Mild atherosclerosis. Marked upper abdominal adenopathy. Gastrohepatic ligament node measures 3.7 x 5.0 cm on image 60  Portacaval node which measures 4.6 x 5.6 cm on image 70. No pelvic adenopathy.  Reproductive: Normal prostate.  Other:  No significant free fluid.  Fat containing umbilical hernia.  Musculoskeletal: No acute osseous abnormality.  IMPRESSION: 1. Extensive thoracic and upper abdominal adenopathy with peribronchovascular pulmonary micro nodularity. Favor sarcoidosis. Lymphoma is felt less likely. 2. No acute superimposed process. The right lower lobe abnormality on chest radiograph likely represents an area of scarring versus inflammation related to presumed sarcoidosis. 3. Age advanced coronary artery atherosclerosis. Recommend assessment of coronary risk factors and consideration of medical therapy.   Electronically Signed   By: Jeronimo Greaves M.D.   On: 05/11/2014 19:51   Ct Cervical Spine Wo Contrast  05/11/2014   CLINICAL DATA:  Two syncopal episodes.  Fall.  Scalp contusion.  EXAM: CT HEAD WITHOUT CONTRAST  CT CERVICAL SPINE WITHOUT CONTRAST  TECHNIQUE: Multidetector CT imaging of the head and cervical spine was performed following the standard protocol without intravenous  contrast. Multiplanar CT image reconstructions of the cervical spine were also generated.  COMPARISON:  None.  FINDINGS: CT HEAD FINDINGS  Skull and Sinuses:Negative for acute fracture or destructive process. Deformity of the nasal arch appears chronic. No effusion in the imaged paranasal sinuses. Mastoids and middle ears are clear.  Orbits: No acute abnormality.  Brain: No evidence of acute abnormality, such as acute infarction, hemorrhage, hydrocephalus, or mass lesion/mass effect.  CT CERVICAL SPINE FINDINGS  Partially visualized adenopathy in the upper mediastinum. A right upper paratracheal node measures at least 13 mm in short axis. Right supraclavicular lymphadenopathy with a node measuring 2  cm in short axis.  Negative for acute fracture. No traumatic malalignment. Cervical spondylosis with small central disc herniations, most notable at C3-4 and C4-5. These likely contact the ventral cord. No gross cervical canal hematoma or prevertebral swelling.  IMPRESSION: 1. No acute intracranial injury or cervical spine fracture. 2. Upper mediastinal and right supraclavicular lymphadenopathy. Recommend enhanced chest CT.   Electronically Signed   By: Tiburcio Pea M.D.   On: 05/11/2014 18:20   Ct Abdomen Pelvis W Contrast  05/11/2014   CLINICAL DATA:  Thoracic adenopathy on chest radiograph.Lymphadenopathy R59.1 (ICD-10-CM)  EXAM: CT CHEST, ABDOMEN, AND PELVIS WITH CONTRAST  TECHNIQUE: Multidetector CT imaging of the chest, abdomen and pelvis was performed following the standard protocol during bolus administration of intravenous contrast.  CONTRAST:  OMNIPAQUE IOHEXOL 300 MG/ML  SOLN  COMPARISON:  Chest radiograph of same date and back to 08/16/2007  FINDINGS: CT CHEST FINDINGS  Lungs/Pleura: Probable scarring in the right lower lobe laterally. This corresponds to the plain film abnormality. Example image 53 of series 7.  Bronchovascular nodularity which is bilateral and slightly upper lobe predominant.   No pleural fluid.  Heart/Mediastinum: Right supraclavicular node measures 1.2 cm on image 4.  No axillary adenopathy. Heart size upper normal, without pericardial effusion. LAD coronary artery atherosclerosis on image 29.  Extensive thoracic adenopathy. Right paratracheal nodal conglomerate measures 4.1 x 2.6 cm on image 21.  Subcarinal nodal mass measures 4.6 x 8.5 cm on image 32.  Right hilar node measures 2.5 cm on image 28. Left infrahilar adenopathy.  Index prevascular node measures 2.6 cm on image 20.  CT ABDOMEN AND PELVIS FINDINGS  Hepatobiliary: Too small to characterize right hepatic lobe lesion at 3 mm. Normal gallbladder, without biliary ductal dilatation.  Pancreas: Normal, without mass or pancreatic ductal dilatation.  Spleen: Normal, without splenomegaly.  Urinary tract: Normal adrenal glands. Interpolar right renal lesion which is too small to characterize. Normal ureters and urinary bladder.  Stomach/Bowel: Normal stomach, without wall thickening. Proximal duodenum is displaced by adenopathy, but nonobstructed. Normal colon, appendix, and terminal ileum. Normal small bowel, without mesenteric adenopathy.  Vascular/Lymphatic: Mild atherosclerosis. Marked upper abdominal adenopathy. Gastrohepatic ligament node measures 3.7 x 5.0 cm on image 60  Portacaval node which measures 4.6 x 5.6 cm on image 70. No pelvic adenopathy.  Reproductive: Normal prostate.  Other:  No significant free fluid.  Fat containing umbilical hernia.  Musculoskeletal: No acute osseous abnormality.  IMPRESSION: 1. Extensive thoracic and upper abdominal adenopathy with peribronchovascular pulmonary micro nodularity. Favor sarcoidosis. Lymphoma is felt less likely. 2. No acute superimposed process. The right lower lobe abnormality on chest radiograph likely represents an area of scarring versus inflammation related to presumed sarcoidosis. 3. Age advanced coronary artery atherosclerosis. Recommend assessment of coronary risk factors  and consideration of medical therapy.   Electronically Signed   By: Jeronimo Greaves M.D.   On: 05/11/2014 19:51    Scheduled Meds: . atropine  1 drop Both Eyes QHS  . cefUROXime  250 mg Oral BID WC  . folic acid  1 mg Oral Daily  . heparin  5,000 Units Subcutaneous 3 times per day  . homatropine  1 drop Both Eyes QID  . multivitamin with minerals  1 tablet Oral Daily  . thiamine  100 mg Oral Daily   Continuous Infusions: . sodium chloride 75 mL/hr at 05/12/14 0532    Active Problems:   Hypertension   Mediastinal adenopathy   Sepsis   UTI (lower urinary  tract infection)    Time spent: 35min    Foster Sonnier K  [facility] Pager [phone]. If 7PM-7AM, please contact night-coverage at [email or web address], password [email or web address] 05/12/2014, 12:41 PM  LOS: 1 day

## 2014-05-12 NOTE — Treatment Plan (Signed)
Regarding marked adenopathy, had discussed case with Pulmonary, who reports EBUS may be an option. Also recs to discuss with IR as R supraclavicular node may be an option for biopsy. Discussed with IR with recs and plans for US guided biopsy of R supraclavicular node on 10/30. Orders placed. Appreciate Pulmonary consultation. Will follow.

## 2014-05-12 NOTE — Progress Notes (Signed)
Echocardiogram 2D Echocardiogram has been performed.  Derek Bennett 05/12/2014, 10:58 AM

## 2014-05-12 NOTE — Progress Notes (Signed)
VASCULAR LAB PRELIMINARY  PRELIMINARY  PRELIMINARY  PRELIMINARY  Carotid duplex  completed.    Preliminary report:  Bilateral:  1-39% ICA stenosis.  Vertebral artery flow is antegrade.      Julanne Schlueter, RVT 05/12/2014, 9:15 AM

## 2014-05-13 ENCOUNTER — Inpatient Hospital Stay (HOSPITAL_COMMUNITY): Payer: PRIVATE HEALTH INSURANCE

## 2014-05-13 LAB — CBC
HEMATOCRIT: 33.8 % — AB (ref 39.0–52.0)
HEMOGLOBIN: 11.5 g/dL — AB (ref 13.0–17.0)
MCH: 28.9 pg (ref 26.0–34.0)
MCHC: 34 g/dL (ref 30.0–36.0)
MCV: 84.9 fL (ref 78.0–100.0)
Platelets: 244 10*3/uL (ref 150–400)
RBC: 3.98 MIL/uL — ABNORMAL LOW (ref 4.22–5.81)
RDW: 13.3 % (ref 11.5–15.5)
WBC: 7.6 10*3/uL (ref 4.0–10.5)

## 2014-05-13 LAB — HIV ANTIBODY (ROUTINE TESTING W REFLEX): HIV 1&2 Ab, 4th Generation: NONREACTIVE

## 2014-05-13 LAB — GLUCOSE, CAPILLARY
Glucose-Capillary: 82 mg/dL (ref 70–99)
Glucose-Capillary: 91 mg/dL (ref 70–99)

## 2014-05-13 LAB — BASIC METABOLIC PANEL
Anion gap: 12 (ref 5–15)
BUN: 14 mg/dL (ref 6–23)
CALCIUM: 8.8 mg/dL (ref 8.4–10.5)
CHLORIDE: 101 meq/L (ref 96–112)
CO2: 21 mEq/L (ref 19–32)
CREATININE: 1.08 mg/dL (ref 0.50–1.35)
GFR calc non Af Amer: 79 mL/min — ABNORMAL LOW (ref >90)
Glucose, Bld: 92 mg/dL (ref 70–99)
Potassium: 4 mEq/L (ref 3.7–5.3)
Sodium: 134 mEq/L — ABNORMAL LOW (ref 137–147)

## 2014-05-13 MED ORDER — CEFUROXIME AXETIL 250 MG PO TABS: 250.0000 mg | ORAL_TABLET | Freq: Two times a day (BID) | ORAL | Status: DC

## 2014-05-13 MED ORDER — HOMATROPINE HBR 5 % OP SOLN: 1.0000 [drp] | Freq: Four times a day (QID) | OPHTHALMIC | Status: DC

## 2014-05-13 MED ORDER — MIDAZOLAM HCL 2 MG/2ML IJ SOLN
INTRAMUSCULAR | Status: AC | PRN
  Administered 2014-05-13: 2 mg via INTRAVENOUS

## 2014-05-13 MED ORDER — FENTANYL CITRATE 0.05 MG/ML IJ SOLN
INTRAMUSCULAR | Status: AC
  Filled 2014-05-13: qty 4

## 2014-05-13 MED ORDER — MIDAZOLAM HCL 2 MG/2ML IJ SOLN
INTRAMUSCULAR | Status: AC
  Filled 2014-05-13: qty 4

## 2014-05-13 MED ORDER — FENTANYL CITRATE 0.05 MG/ML IJ SOLN
INTRAMUSCULAR | Status: AC | PRN
  Administered 2014-05-13: 100 ug via INTRAVENOUS

## 2014-05-13 MED ORDER — HEPARIN SODIUM (PORCINE) 5000 UNIT/ML IJ SOLN
5000.0000 [IU] | Freq: Three times a day (TID) | INTRAMUSCULAR | Status: DC
  Filled 2014-05-13: qty 1

## 2014-05-13 NOTE — Discharge Summary (Signed)
Physician Discharge Summary  Derek ComesBryant K Danker WUJ:[id number] DOB: 07/16/1965 DOA: 05/11/2014  PCP: Mickie HillierLITTLE,KEVIN LORNE, MD  Admit date: 05/11/2014 Discharge date: 05/13/2014  Time spent: 35 minutes  Recommendations for Outpatient Follow-up:  1. Follow up with Dr. Delton CoombesByrum as scheduled 2. Follow up with PCP in 1-2 weeks  Discharge Diagnoses:  Active Problems:   Hypertension   Mediastinal adenopathy   Sepsis   UTI (lower urinary tract infection)   Discharge Condition: Improved  Diet recommendation: Regular  Filed Weights   05/11/14 1817 05/11/14 2249  Weight: 110.678 kg (244 lb) 111.403 kg (245 lb 9.6 oz)    History of present illness:  Please see admit h and p from 10/28 for details. Briefly, pt presents with syncope, found to be septic with evidence of UTI. The patient was also noted to have evidence of significant mediastinal adenopathy. He was admitted for further work up.  Hospital Course:  1. Sepsis with UTI -iimproved with rocephin  -WBC trended down  -cultures have been ordered - blood cx neg thus far, urine cx with >100,000 ecoli  -transitioned to PO ceftin 10/29  2. Hypertension  -home medications were placed on hold on admit  4. 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 Pt is s/p US guided R supraclavicular node biopsy 10/30, results pending  -If inconclusive biopsy result, may benefit from EBUS per Pulm  -Dr. Delton CoombesByrum with Pulmonary to follow up on biopsy results 5. Syncope  -likely related to UTI and sepsis  -echo unremarkable -carotid dopplers without stenosis  Procedures:  R supraclavicular node biopsy 10/30  Consultations:  IR  Pulmonary  Discharge Exam: Filed Vitals:   05/13/14 0933 05/13/14 0937 05/13/14 1010 05/13/14 1110  BP: 118/68 112/66 115/65  121/74  Pulse: 85 79 68 74  Temp:   97.3 F (36.3 C) 97.4 F (36.3 C)  TempSrc:   Oral Oral  Resp: 23 24 20 18   Height:      Weight:      SpO2: 97% 97% 97% 100%    General: awake, in nad Cardiovascular: regular, s1, s2 Respiratory: normal resp effort, no wheezing  Discharge Instructions     Medication List         atropine 1 % ophthalmic solution  Place 1 drop into both eyes at bedtime.     cefUROXime 250 MG tablet  Commonly known as:  CEFTIN  Take 1 tablet (250 mg total) by mouth 2 (two) times daily with a meal.     homatropine 5 % ophthalmic solution  Place 1 drop into both eyes 4 (four) times daily.     lisinopril-hydrochlorothiazide 20-12.5 MG per tablet  Commonly known as:  PRINZIDE,ZESTORETIC  Take 1 tablet by mouth daily.       Allergies  Allergen Reactions  . Eggs Or Egg-Derived Products Shortness Of Breath   Follow-up Information   Follow up with Leslye PeerBYRUM,ROBERT S., MD On 05/20/2014. (9:45 am)    Specialty:  Pulmonary Disease   Contact information:   [address] [place] [address] [phone]       Follow up with Mickie HillierLITTLE,KEVIN LORNE, MD. Schedule an appointment as soon as possible for a visit in 1 week.   Specialty:  Family Medicine   Contact information:   [address] [place] [address] [phone]  The results of significant diagnostics from this hospitalization (including imaging, microbiology, ancillary and laboratory) are listed below for reference.    Significant Diagnostic Studies: Dg Chest 2 View  05/11/2014   CLINICAL DATA:  Syncope.  R55.  EXAM: CHEST  2 VIEW  COMPARISON:  08/16/2007 chest radiograph and cervical spine CT of earlier today  FINDINGS: Midline trachea. Normal heart size. Extensive mediastinal and bilateral hilar adenopathy. No pleural effusion or pneumothorax. Pulmonary interstitial prominence is new since the prior radiograph. Cannot exclude nodule projecting over the right costophrenic angle on  the frontal film. 1.3 cm. Not well localized on the lateral.  IMPRESSION: Extensive mediastinal and bilateral hilar adenopathy. Concurrent pulmonary interstitial thickening. Considerations include sarcoidosis, lymphoproliferative disorder such as lymphoma, or small cell lung carcinoma. Further evaluation with contrast-enhanced chest CT should be considered. These results were called by telephone at the time of interpretation on 05/11/2014 at 6:28 pm to Dr. Dierdre Forth , who verbally acknowledged these results.  Possible right lung base nodule laterally. This would also be better evaluated with chest CT.   Electronically Signed   By: Jeronimo Greaves M.D.   On: 05/11/2014 18:31   Ct Head Wo Contrast  05/11/2014   CLINICAL DATA:  Two syncopal episodes.  Fall.  Scalp contusion.  EXAM: CT HEAD WITHOUT CONTRAST  CT CERVICAL SPINE WITHOUT CONTRAST  TECHNIQUE: Multidetector CT imaging of the head and cervical spine was performed following the standard protocol without intravenous contrast. Multiplanar CT image reconstructions of the cervical spine were also generated.  COMPARISON:  None.  FINDINGS: CT HEAD FINDINGS  Skull and Sinuses:Negative for acute fracture or destructive process. Deformity of the nasal arch appears chronic. No effusion in the imaged paranasal sinuses. Mastoids and middle ears are clear.  Orbits: No acute abnormality.  Brain: No evidence of acute abnormality, such as acute infarction, hemorrhage, hydrocephalus, or mass lesion/mass effect.  CT CERVICAL SPINE FINDINGS  Partially visualized adenopathy in the upper mediastinum. A right upper paratracheal node measures at least 13 mm in short axis. Right supraclavicular lymphadenopathy with a node measuring 2 cm in short axis.  Negative for acute fracture. No traumatic malalignment. Cervical spondylosis with small central disc herniations, most notable at C3-4 and C4-5. These likely contact the ventral cord. No gross cervical canal hematoma or  prevertebral swelling.  IMPRESSION: 1. No acute intracranial injury or cervical spine fracture. 2. Upper mediastinal and right supraclavicular lymphadenopathy. Recommend enhanced chest CT.   Electronically Signed   By: Tiburcio Pea M.D.   On: 05/11/2014 18:20   Ct Chest W Contrast  05/11/2014   CLINICAL DATA:  Thoracic adenopathy on chest radiograph.Lymphadenopathy R59.1 (ICD-10-CM)  EXAM: CT CHEST, ABDOMEN, AND PELVIS WITH CONTRAST  TECHNIQUE: Multidetector CT imaging of the chest, abdomen and pelvis was performed following the standard protocol during bolus administration of intravenous contrast.  CONTRAST:  OMNIPAQUE IOHEXOL 300 MG/ML  SOLN  COMPARISON:  Chest radiograph of same date and back to 08/16/2007  FINDINGS: CT CHEST FINDINGS  Lungs/Pleura: Probable scarring in the right lower lobe laterally. This corresponds to the plain film abnormality. Example image 53 of series 7.  Bronchovascular nodularity which is bilateral and slightly upper lobe predominant.  No pleural fluid.  Heart/Mediastinum: Right supraclavicular node measures 1.2 cm on image 4.  No axillary adenopathy. Heart size upper normal, without pericardial effusion. LAD coronary artery atherosclerosis on image 29.  Extensive thoracic adenopathy. Right paratracheal nodal conglomerate measures 4.1 x 2.6 cm on image 21.  Subcarinal  nodal mass measures 4.6 x 8.5 cm on image 32.  Right hilar node measures 2.5 cm on image 28. Left infrahilar adenopathy.  Index prevascular node measures 2.6 cm on image 20.  CT ABDOMEN AND PELVIS FINDINGS  Hepatobiliary: Too small to characterize right hepatic lobe lesion at 3 mm. Normal gallbladder, without biliary ductal dilatation.  Pancreas: Normal, without mass or pancreatic ductal dilatation.  Spleen: Normal, without splenomegaly.  Urinary tract: Normal adrenal glands. Interpolar right renal lesion which is too small to characterize. Normal ureters and urinary bladder.  Stomach/Bowel: Normal stomach,  without wall thickening. Proximal duodenum is displaced by adenopathy, but nonobstructed. Normal colon, appendix, and terminal ileum. Normal small bowel, without mesenteric adenopathy.  Vascular/Lymphatic: Mild atherosclerosis. Marked upper abdominal adenopathy. Gastrohepatic ligament node measures 3.7 x 5.0 cm on image 60  Portacaval node which measures 4.6 x 5.6 cm on image 70. No pelvic adenopathy.  Reproductive: Normal prostate.  Other:  No significant free fluid.  Fat containing umbilical hernia.  Musculoskeletal: No acute osseous abnormality.  IMPRESSION: 1. Extensive thoracic and upper abdominal adenopathy with peribronchovascular pulmonary micro nodularity. Favor sarcoidosis. Lymphoma is felt less likely. 2. No acute superimposed process. The right lower lobe abnormality on chest radiograph likely represents an area of scarring versus inflammation related to presumed sarcoidosis. 3. Age advanced coronary artery atherosclerosis. Recommend assessment of coronary risk factors and consideration of medical therapy.   Electronically Signed   By: Jeronimo Greaves M.D.   On: 05/11/2014 19:51   Ct Cervical Spine Wo Contrast  05/11/2014   CLINICAL DATA:  Two syncopal episodes.  Fall.  Scalp contusion.  EXAM: CT HEAD WITHOUT CONTRAST  CT CERVICAL SPINE WITHOUT CONTRAST  TECHNIQUE: Multidetector CT imaging of the head and cervical spine was performed following the standard protocol without intravenous contrast. Multiplanar CT image reconstructions of the cervical spine were also generated.  COMPARISON:  None.  FINDINGS: CT HEAD FINDINGS  Skull and Sinuses:Negative for acute fracture or destructive process. Deformity of the nasal arch appears chronic. No effusion in the imaged paranasal sinuses. Mastoids and middle ears are clear.  Orbits: No acute abnormality.  Brain: No evidence of acute abnormality, such as acute infarction, hemorrhage, hydrocephalus, or mass lesion/mass effect.  CT CERVICAL SPINE FINDINGS  Partially  visualized adenopathy in the upper mediastinum. A right upper paratracheal node measures at least 13 mm in short axis. Right supraclavicular lymphadenopathy with a node measuring 2 cm in short axis.  Negative for acute fracture. No traumatic malalignment. Cervical spondylosis with small central disc herniations, most notable at C3-4 and C4-5. These likely contact the ventral cord. No gross cervical canal hematoma or prevertebral swelling.  IMPRESSION: 1. No acute intracranial injury or cervical spine fracture. 2. Upper mediastinal and right supraclavicular lymphadenopathy. Recommend enhanced chest CT.   Electronically Signed   By: Tiburcio Pea M.D.   On: 05/11/2014 18:20   Ct Abdomen Pelvis W Contrast  05/11/2014   CLINICAL DATA:  Thoracic adenopathy on chest radiograph.Lymphadenopathy R59.1 (ICD-10-CM)  EXAM: CT CHEST, ABDOMEN, AND PELVIS WITH CONTRAST  TECHNIQUE: Multidetector CT imaging of the chest, abdomen and pelvis was performed following the standard protocol during bolus administration of intravenous contrast.  CONTRAST:  OMNIPAQUE IOHEXOL 300 MG/ML  SOLN  COMPARISON:  Chest radiograph of same date and back to 08/16/2007  FINDINGS: CT CHEST FINDINGS  Lungs/Pleura: Probable scarring in the right lower lobe laterally. This corresponds to the plain film abnormality. Example image 53 of series 7.  Bronchovascular nodularity  which is bilateral and slightly upper lobe predominant.  No pleural fluid.  Heart/Mediastinum: Right supraclavicular node measures 1.2 cm on image 4.  No axillary adenopathy. Heart size upper normal, without pericardial effusion. LAD coronary artery atherosclerosis on image 29.  Extensive thoracic adenopathy. Right paratracheal nodal conglomerate measures 4.1 x 2.6 cm on image 21.  Subcarinal nodal mass measures 4.6 x 8.5 cm on image 32.  Right hilar node measures 2.5 cm on image 28. Left infrahilar adenopathy.  Index prevascular node measures 2.6 cm on image 20.  CT ABDOMEN AND  PELVIS FINDINGS  Hepatobiliary: Too small to characterize right hepatic lobe lesion at 3 mm. Normal gallbladder, without biliary ductal dilatation.  Pancreas: Normal, without mass or pancreatic ductal dilatation.  Spleen: Normal, without splenomegaly.  Urinary tract: Normal adrenal glands. Interpolar right renal lesion which is too small to characterize. Normal ureters and urinary bladder.  Stomach/Bowel: Normal stomach, without wall thickening. Proximal duodenum is displaced by adenopathy, but nonobstructed. Normal colon, appendix, and terminal ileum. Normal small bowel, without mesenteric adenopathy.  Vascular/Lymphatic: Mild atherosclerosis. Marked upper abdominal adenopathy. Gastrohepatic ligament node measures 3.7 x 5.0 cm on image 60  Portacaval node which measures 4.6 x 5.6 cm on image 70. No pelvic adenopathy.  Reproductive: Normal prostate.  Other:  No significant free fluid.  Fat containing umbilical hernia.  Musculoskeletal: No acute osseous abnormality.  IMPRESSION: 1. Extensive thoracic and upper abdominal adenopathy with peribronchovascular pulmonary micro nodularity. Favor sarcoidosis. Lymphoma is felt less likely. 2. No acute superimposed process. The right lower lobe abnormality on chest radiograph likely represents an area of scarring versus inflammation related to presumed sarcoidosis. 3. Age advanced coronary artery atherosclerosis. Recommend assessment of coronary risk factors and consideration of medical therapy.   Electronically Signed   By: Jeronimo Greaves M.D.   On: 05/11/2014 19:51   US Biopsy  05/13/2014   CLINICAL DATA:  Diffuse lymphadenopathy in the chest and abdomen, including the right supraclavicular region. The patient presents for lymph node biopsy.  EXAM: ULTRASOUND GUIDED CORE BIOPSY OF RIGHT SUPRACLAVICULAR LYMPH NODE  MEDICATIONS: 2.0 mg IV Versed; 100 mcg IV Fentanyl  Total Moderate Sedation Time: 15 min  PROCEDURE: The procedure, risks, benefits, and alternatives were  explained to the patient. Questions regarding the procedure were encouraged and answered. The patient understands and consents to the procedure.  The right neck was prepped with Betadine in a sterile fashion, and a sterile drape was applied covering the operative field. A sterile gown and sterile gloves were used for the procedure. Local anesthesia was provided with 1% Lidocaine.  A time-out procedure was performed. Ultrasound was used to localize an enlarged right supraclavicular lymph node. Core biopsy was performed under direct ultrasound guidance with an 18 gauge needle device. A total of 5 samples were obtained and submitted in saline.  COMPLICATIONS: None.  FINDINGS: Ultrasound demonstrates an enlarged right supraclavicular lymph node measuring approximately 1.2 x 2.2 cm. Solid tissue was obtained. There were no immediate complications.  IMPRESSION: Ultrasound-guided core biopsy performed of an enlarged right supraclavicular lymph node.   Electronically Signed   By: Irish Lack M.D.   On: 05/13/2014 10:40    Microbiology: Recent Results (from the past 240 hour(s))  CULTURE, BLOOD (ROUTINE X 2)     Status: None   Collection Time    05/11/14  6:27 PM      Result Value Ref Range Status   Specimen Description BLOOD LEFT ANTECUBITAL   Final   Special Requests  BOTTLES DRAWN AEROBIC AND ANAEROBIC 5CC   Final   Culture  Setup Time     Final   Value: 05/11/2014 22:46     Performed at [facility]   Culture     Final   Value:        BLOOD CULTURE RECEIVED NO GROWTH TO DATE CULTURE WILL BE HELD FOR 5 DAYS BEFORE ISSUING A FINAL NEGATIVE REPORT     Performed at [facility]   Report Status PENDING   Incomplete  URINE CULTURE     Status: None   Collection Time    05/11/14  6:32 PM      Result Value Ref Range Status   Specimen Description URINE, CLEAN CATCH   Final   Special Requests NONE   Final   Culture  Setup Time     Final   Value: 05/12/2014 01:12     Performed at [facility]   Colony Count     Final   Value: >=100,000 COLONIES/ML     Performed at [facility]   Culture     Final   Value: ESCHERICHIA COLI     Performed at [facility]   Report Status PENDING   Incomplete  CULTURE, BLOOD (ROUTINE X 2)     Status: None   Collection Time    05/11/14  6:49 PM      Result Value Ref Range Status   Specimen Description BLOOD RIGHT ANTECUBITAL   Final   Special Requests BOTTLES DRAWN AEROBIC AND ANAEROBIC 4ML   Final   Culture  Setup Time     Final   Value: 05/11/2014 22:40     Performed at [facility]   Culture     Final   Value:        BLOOD CULTURE RECEIVED NO GROWTH TO DATE CULTURE WILL BE HELD FOR 5 DAYS BEFORE ISSUING A FINAL NEGATIVE REPORT     Performed at [facility]   Report Status PENDING   Incomplete     Labs: Basic Metabolic Panel:  Recent Labs Lab 05/11/14 1806 05/12/14 0510 05/13/14 0406  NA 132* 136* 134*  K 4.3 4.2 4.0  CL 98 103 101  CO2 20 22 21   GLUCOSE 104* 94 92  BUN 15 14 14   CREATININE 1.24 1.13 1.08  CALCIUM 9.0 8.7 8.8   Liver Function Tests:  Recent Labs Lab 05/11/14 1806 05/12/14 0510  AST 31 27  ALT 34 29  ALKPHOS 56 50  BILITOT 0.5 0.5  PROT 8.0 7.3  ALBUMIN 3.4* 3.0*   No results found for this basename: LIPASE, AMYLASE,  in the last 168 hours No results found for this basename: AMMONIA,  in the last 168 hours CBC:  Recent Labs Lab 05/11/14 1806 05/12/14 0510 05/13/14 0406  WBC 14.9* 13.9* 7.6  NEUTROABS 11.2*  --   --   HGB 12.6* 11.5* 11.5*  HCT 35.5* 34.1* 33.8*  MCV 84.3 86.3 84.9  PLT 256 243 244   Cardiac Enzymes:  Recent Labs Lab 05/11/14 1806  TROPONINI <0.30   BNP: BNP (last 3 results) No results found for this basename: PROBNP,  in the last 8760 hours CBG:  Recent Labs Lab 05/12/14 0758 05/13/14 0756 05/13/14 1007  GLUCAP 89 82 91   Signed:  CHIU, STEPHEN K  [facility] 05/13/2014, 1:20 PM

## 2014-05-13 NOTE — Plan of Care (Signed)
Problem: Phase I Progression Outcomes Goal: OOB as tolerated unless otherwise ordered Outcome: Completed/Met Date Met:  05/13/14 Up as tol in room         

## 2014-05-13 NOTE — Plan of Care (Signed)
Problem: Discharge Progression Outcomes Goal: Discharge plan in place and appropriate Outcome: Completed/Met Date Met:  05/13/14 Discharge home with spouse

## 2014-05-13 NOTE — Progress Notes (Addendum)
Name: Derek Bennett MRN: [id number] DOB: [birth year]    ADMISSION DATE:  05/11/2014 CONSULTATION DATE:  05/12/14  REFERRING MD :  [facility]  CHIEF COMPLAINT:  Chest lyphadenoapthy  BRIEF PATIENT DESCRIPTION: 48 yr old asmittted UTI, found with chest lymphadenoapthy  SIGNIFICANT EVENTS    STUDIES:  10/28 CT head>>>No acute intracranial injury or cervical spine fracture.2. Upper mediastinal and right supraclavicular lymphadenopathy Recommend enhanced chest CT. 10/29 CT chest >>>Extensive thoracic and upper abdominal adenopathy with peribronchovascular pulmonary micro nodularity. Favor sarcoidosis. Lymphoma is felt less likely. No acute superimposed process. The right lower lobe abnormality on chest radiograph likely represents an area of scarring versus inflammation related to presumed sarcoidosis 10/29 Echo- WNL 10/30 lymph node bx.  HISTORY OF PRESENT ILLNESS:  48 y.o. male presents with syncopal episode. He had no chest pain or sob associated. No loss of bowel or bladder control. Patient states that he has had sweating but no fevers. Found with sepsis related to UTI. Fluid resuscitation given. CT head neg but lymphadenoapthy noted that promted CT chest abdo that reveled sig adenopathy. Consult requested concern work up lymphadenopathy. Had lactic acidosis mild that cleared. Mild renal insuff that improved with volume.  SUBJECTIVE:  Just returned from IR for bx. NAD  VITAL SIGNS: Temp:  [97.3 F (36.3 C)-98.3 F (36.8 C)] 97.3 F (36.3 C) (10/30 1010) Pulse Rate:  [68-88] 68 (10/30 1010) Resp:  [16-24] 20 (10/30 1010) BP: (109-132)/(65-78) 115/65 mmHg (10/30 1010) SpO2:  [97 %-100 %] 97 % (10/30 1010)  PHYSICAL EXAMINATION: General:  Awake, muscular, no distress Neuro:  Awake, O x 4, CN intact HEENT: dressing rt supraclavicular area WNL, neck wnl Cardiovascular:  s1 s2 RRR distant Lungs:  CTA Abdomen:  Soft, BS wnl, no palp liver, spleen, no r Musculoskeletal: no edema Skin:   No rash   Recent Labs Lab 05/11/14 1806 05/12/14 0510 05/13/14 0406  NA 132* 136* 134*  K 4.3 4.2 4.0  CL 98 103 101  CO2 20 22 21   BUN 15 14 14   CREATININE 1.24 1.13 1.08  GLUCOSE 104* 94 92    Recent Labs Lab 05/11/14 1806 05/12/14 0510 05/13/14 0406  HGB 12.6* 11.5* 11.5*  HCT 35.5* 34.1* 33.8*  WBC 14.9* 13.9* 7.6  PLT 256 243 244   Dg Chest 2 View  05/11/2014   CLINICAL DATA:  Syncope.  R55.  EXAM: CHEST  2 VIEW  COMPARISON:  08/16/2007 chest radiograph and cervical spine CT of earlier today  FINDINGS: Midline trachea. Normal heart size. Extensive mediastinal and bilateral hilar adenopathy. No pleural effusion or pneumothorax. Pulmonary interstitial prominence is new since the prior radiograph. Cannot exclude nodule projecting over the right costophrenic angle on the frontal film. 1.3 cm. Not well localized on the lateral.  IMPRESSION: Extensive mediastinal and bilateral hilar adenopathy. Concurrent pulmonary interstitial thickening. Considerations include sarcoidosis, lymphoproliferative disorder such as lymphoma, or small cell lung carcinoma. Further evaluation with contrast-enhanced chest CT should be considered. These results were called by telephone at the time of interpretation on 05/11/2014 at 6:28 pm to Dr. Dierdre Forth , who verbally acknowledged these results.  Possible right lung base nodule laterally. This would also be better evaluated with chest CT.   Electronically Signed   By: Jeronimo Greaves M.D.   On: 05/11/2014 18:31   Ct Head Wo Contrast  05/11/2014   CLINICAL DATA:  Two syncopal episodes.  Fall.  Scalp contusion.  EXAM: CT HEAD WITHOUT CONTRAST  CT CERVICAL SPINE WITHOUT  CONTRAST  TECHNIQUE: Multidetector CT imaging of the head and cervical spine was performed following the standard protocol without intravenous contrast. Multiplanar CT image reconstructions of the cervical spine were also generated.  COMPARISON:  None.  FINDINGS: CT HEAD FINDINGS  Skull  and Sinuses:Negative for acute fracture or destructive process. Deformity of the nasal arch appears chronic. No effusion in the imaged paranasal sinuses. Mastoids and middle ears are clear.  Orbits: No acute abnormality.  Brain: No evidence of acute abnormality, such as acute infarction, hemorrhage, hydrocephalus, or mass lesion/mass effect.  CT CERVICAL SPINE FINDINGS  Partially visualized adenopathy in the upper mediastinum. A right upper paratracheal node measures at least 13 mm in short axis. Right supraclavicular lymphadenopathy with a node measuring 2 cm in short axis.  Negative for acute fracture. No traumatic malalignment. Cervical spondylosis with small central disc herniations, most notable at C3-4 and C4-5. These likely contact the ventral cord. No gross cervical canal hematoma or prevertebral swelling.  IMPRESSION: 1. No acute intracranial injury or cervical spine fracture. 2. Upper mediastinal and right supraclavicular lymphadenopathy. Recommend enhanced chest CT.   Electronically Signed   By: Tiburcio PeaJonathan  Watts M.D.   On: 05/11/2014 18:20   Ct Chest W Contrast  05/11/2014   CLINICAL DATA:  Thoracic adenopathy on chest radiograph.Lymphadenopathy R59.1 (ICD-10-CM)  EXAM: CT CHEST, ABDOMEN, AND PELVIS WITH CONTRAST  TECHNIQUE: Multidetector CT imaging of the chest, abdomen and pelvis was performed following the standard protocol during bolus administration of intravenous contrast.  CONTRAST:  100mL OMNIPAQUE IOHEXOL 300 MG/ML  SOLN  COMPARISON:  Chest radiograph of same date and back to 08/16/2007  FINDINGS: CT CHEST FINDINGS  Lungs/Pleura: Probable scarring in the right lower lobe laterally. This corresponds to the plain film abnormality. Example image 53 of series 7.  Bronchovascular nodularity which is bilateral and slightly upper lobe predominant.  No pleural fluid.  Heart/Mediastinum: Right supraclavicular node measures 1.2 cm on image 4.  No axillary adenopathy. Heart size upper normal, without  pericardial effusion. LAD coronary artery atherosclerosis on image 29.  Extensive thoracic adenopathy. Right paratracheal nodal conglomerate measures 4.1 x 2.6 cm on image 21.  Subcarinal nodal mass measures 4.6 x 8.5 cm on image 32.  Right hilar node measures 2.5 cm on image 28. Left infrahilar adenopathy.  Index prevascular node measures 2.6 cm on image 20.  CT ABDOMEN AND PELVIS FINDINGS  Hepatobiliary: Too small to characterize right hepatic lobe lesion at 3 mm. Normal gallbladder, without biliary ductal dilatation.  Pancreas: Normal, without mass or pancreatic ductal dilatation.  Spleen: Normal, without splenomegaly.  Urinary tract: Normal adrenal glands. Interpolar right renal lesion which is too small to characterize. Normal ureters and urinary bladder.  Stomach/Bowel: Normal stomach, without wall thickening. Proximal duodenum is displaced by adenopathy, but nonobstructed. Normal colon, appendix, and terminal ileum. Normal small bowel, without mesenteric adenopathy.  Vascular/Lymphatic: Mild atherosclerosis. Marked upper abdominal adenopathy. Gastrohepatic ligament node measures 3.7 x 5.0 cm on image 60  Portacaval node which measures 4.6 x 5.6 cm on image 70. No pelvic adenopathy.  Reproductive: Normal prostate.  Other:  No significant free fluid.  Fat containing umbilical hernia.  Musculoskeletal: No acute osseous abnormality.  IMPRESSION: 1. Extensive thoracic and upper abdominal adenopathy with peribronchovascular pulmonary micro nodularity. Favor sarcoidosis. Lymphoma is felt less likely. 2. No acute superimposed process. The right lower lobe abnormality on chest radiograph likely represents an area of scarring versus inflammation related to presumed sarcoidosis. 3. Age advanced coronary artery atherosclerosis. Recommend assessment  of coronary risk factors and consideration of medical therapy.   Electronically Signed   By: Jeronimo Greaves M.D.   On: 05/11/2014 19:51   Ct Cervical Spine Wo  Contrast  05/11/2014   CLINICAL DATA:  Two syncopal episodes.  Fall.  Scalp contusion.  EXAM: CT HEAD WITHOUT CONTRAST  CT CERVICAL SPINE WITHOUT CONTRAST  TECHNIQUE: Multidetector CT imaging of the head and cervical spine was performed following the standard protocol without intravenous contrast. Multiplanar CT image reconstructions of the cervical spine were also generated.  COMPARISON:  None.  FINDINGS: CT HEAD FINDINGS  Skull and Sinuses:Negative for acute fracture or destructive process. Deformity of the nasal arch appears chronic. No effusion in the imaged paranasal sinuses. Mastoids and middle ears are clear.  Orbits: No acute abnormality.  Brain: No evidence of acute abnormality, such as acute infarction, hemorrhage, hydrocephalus, or mass lesion/mass effect.  CT CERVICAL SPINE FINDINGS  Partially visualized adenopathy in the upper mediastinum. A right upper paratracheal node measures at least 13 mm in short axis. Right supraclavicular lymphadenopathy with a node measuring 2 cm in short axis.  Negative for acute fracture. No traumatic malalignment. Cervical spondylosis with small central disc herniations, most notable at C3-4 and C4-5. These likely contact the ventral cord. No gross cervical canal hematoma or prevertebral swelling.  IMPRESSION: 1. No acute intracranial injury or cervical spine fracture. 2. Upper mediastinal and right supraclavicular lymphadenopathy. Recommend enhanced chest CT.   Electronically Signed   By: Tiburcio Pea M.D.   On: 05/11/2014 18:20   Ct Abdomen Pelvis W Contrast  05/11/2014   CLINICAL DATA:  Thoracic adenopathy on chest radiograph.Lymphadenopathy R59.1 (ICD-10-CM)  EXAM: CT CHEST, ABDOMEN, AND PELVIS WITH CONTRAST  TECHNIQUE: Multidetector CT imaging of the chest, abdomen and pelvis was performed following the standard protocol during bolus administration of intravenous contrast.  CONTRAST:  OMNIPAQUE IOHEXOL 300 MG/ML  SOLN  COMPARISON:  Chest radiograph of  same date and back to 08/16/2007  FINDINGS: CT CHEST FINDINGS  Lungs/Pleura: Probable scarring in the right lower lobe laterally. This corresponds to the plain film abnormality. Example image 53 of series 7.  Bronchovascular nodularity which is bilateral and slightly upper lobe predominant.  No pleural fluid.  Heart/Mediastinum: Right supraclavicular node measures 1.2 cm on image 4.  No axillary adenopathy. Heart size upper normal, without pericardial effusion. LAD coronary artery atherosclerosis on image 29.  Extensive thoracic adenopathy. Right paratracheal nodal conglomerate measures 4.1 x 2.6 cm on image 21.  Subcarinal nodal mass measures 4.6 x 8.5 cm on image 32.  Right hilar node measures 2.5 cm on image 28. Left infrahilar adenopathy.  Index prevascular node measures 2.6 cm on image 20.  CT ABDOMEN AND PELVIS FINDINGS  Hepatobiliary: Too small to characterize right hepatic lobe lesion at 3 mm. Normal gallbladder, without biliary ductal dilatation.  Pancreas: Normal, without mass or pancreatic ductal dilatation.  Spleen: Normal, without splenomegaly.  Urinary tract: Normal adrenal glands. Interpolar right renal lesion which is too small to characterize. Normal ureters and urinary bladder.  Stomach/Bowel: Normal stomach, without wall thickening. Proximal duodenum is displaced by adenopathy, but nonobstructed. Normal colon, appendix, and terminal ileum. Normal small bowel, without mesenteric adenopathy.  Vascular/Lymphatic: Mild atherosclerosis. Marked upper abdominal adenopathy. Gastrohepatic ligament node measures 3.7 x 5.0 cm on image 60  Portacaval node which measures 4.6 x 5.6 cm on image 70. No pelvic adenopathy.  Reproductive: Normal prostate.  Other:  No significant free fluid.  Fat containing umbilical hernia.  Musculoskeletal: No acute osseous abnormality.  IMPRESSION: 1. Extensive thoracic and upper abdominal adenopathy with peribronchovascular pulmonary micro nodularity. Favor sarcoidosis. Lymphoma  is felt less likely. 2. No acute superimposed process. The right lower lobe abnormality on chest radiograph likely represents an area of scarring versus inflammation related to presumed sarcoidosis. 3. Age advanced coronary artery atherosclerosis. Recommend assessment of coronary risk factors and consideration of medical therapy.   Electronically Signed   By: Jeronimo GreavesKyle  Talbot M.D.   On: 05/11/2014 19:51   [place] Biopsy  05/13/2014   CLINICAL DATA:  Diffuse lymphadenopathy in the chest and abdomen, including the right supraclavicular region. The patient presents for lymph node biopsy.  EXAM: ULTRASOUND GUIDED CORE BIOPSY OF RIGHT SUPRACLAVICULAR LYMPH NODE  MEDICATIONS: 2.0 mg IV Versed; 100 mcg IV Fentanyl  Total Moderate Sedation Time: 15 min  PROCEDURE: The procedure, risks, benefits, and alternatives were explained to the patient. Questions regarding the procedure were encouraged and answered. The patient understands and consents to the procedure.  The right neck was prepped with Betadine in a sterile fashion, and a sterile drape was applied covering the operative field. A sterile gown and sterile gloves were used for the procedure. Local anesthesia was provided with 1% Lidocaine.  A time-out procedure was performed. Ultrasound was used to localize an enlarged right supraclavicular lymph node. Core biopsy was performed under direct ultrasound guidance with an 18 gauge needle device. A total of 5 samples were obtained and submitted in saline.  COMPLICATIONS: None.  FINDINGS: Ultrasound demonstrates an enlarged right supraclavicular lymph node measuring approximately 1.2 x 2.2 cm. Solid tissue was obtained. There were no immediate complications.  IMPRESSION: Ultrasound-guided core biopsy performed of an enlarged right supraclavicular lymph node.   Electronically Signed   By: Irish LackGlenn  Yamagata M.D.   On: 05/13/2014 10:40    ASSESSMENT / PLAN:  A. Significant hilar and upper abdominal lymphadenopathy: r/o Sarcoid,  lymphoma, r/o HIV Sepsis from UTI resolving Syncope presumed from sepsis, r/o other cardiac etiology  Now s/p R supraclavicular nodal bx. If no clear dx based on this material then certainly we could arrange for EBUS bx's of mediastinal nodes. Can f/u with Dr Delton CoombesByrum as an outpt > appt made for 05/20/14 at 9:45am. Agree with checking HIV.    Brett CanalesSteve Minor ACNP [facility] Pager [phone] till 3 pm If no answer page [phone] 05/13/2014, 11:58 AM  Levy Pupaobert Alayzia Pavlock, MD, PhD 05/13/2014, 12:22 PM [facility] [phone] or if no answer [phone]

## 2014-05-13 NOTE — Procedures (Signed)
Procedure:  Ultrasound guided core biopsy of right supraclavicular lymph node Findings:    2 cm right [address] LN localized.  18 G core biopsy x 5.  No complications.

## 2014-05-14 LAB — URINE CULTURE: Colony Count: >=100000

## 2014-05-17 LAB — CULTURE, BLOOD (ROUTINE X 2)
CULTURE: NO GROWTH
Culture: NO GROWTH

## 2014-05-20 ENCOUNTER — Encounter: Payer: Self-pay | Admitting: Emergency Medicine

## 2014-05-20 ENCOUNTER — Ambulatory Visit (INDEPENDENT_AMBULATORY_CARE_PROVIDER_SITE_OTHER): Payer: PRIVATE HEALTH INSURANCE | Admitting: Emergency Medicine

## 2014-05-20 VITALS — BP 104/58 | HR 85 | Ht 71.5 in | Wt 248.2 lb

## 2014-05-20 DIAGNOSIS — D869 Sarcoidosis, unspecified: Secondary | ICD-10-CM

## 2014-05-20 MED ORDER — PREDNISONE 20 MG PO TABS: ORAL_TABLET | ORAL | Status: DC

## 2014-05-20 NOTE — Patient Instructions (Signed)
We will start prednisone 40mg  daily for 3 weeks. Then decrease to 20mg  for 1 week. Then stop.  We will perform a CT scan of your chest in one month after the prednisone is finished Follow with Dr. Delton CoombesByrum in one month after the CT scan to review the results

## 2014-05-20 NOTE — Assessment & Plan Note (Signed)
We reviewed his right supraclavicular nodal biopsy today. This shows non-caseating granulomas consistent with sarcoidosis. He will not require EBUS. We will plan to treat with pred x 4 weeks and then repeat his CT scan.

## 2014-05-20 NOTE — Progress Notes (Signed)
HPI:  48 yo man, former cigar smoker, seen in consultation 04/2014 after he was admitted for a syncopal episode. He was found to have extensive mediastinal and perihilar LAD. A bx of his R supraclavicular node shows granulomatous disease consistent with sarcoidosis.   Past Medical History  Diagnosis Date  . Hypertension      No family history on file.   History   Social History  . Marital Status: Married    Spouse Name: N/A    Number of Children: N/A  . Years of Education: N/A   Occupational History  . Not on file.   Social History Main Topics  . Smoking status: Former Smoker    Types: Cigars    Quit date: 05/11/1989  . Smokeless tobacco: Never Used  . Alcohol Use: Yes     Comment: occasional  . Drug Use: No  . Sexual Activity: Not on file   Other Topics Concern  . Not on file   Social History Narrative     Allergies  Allergen Reactions  . Eggs Or Egg-Derived Products Shortness Of Breath     Outpatient Prescriptions Prior to Visit  Medication Sig Dispense Refill  . atropine 1 % ophthalmic solution Place 1 drop into both eyes at bedtime.    . homatropine 5 % ophthalmic solution Place 1 drop into both eyes 4 (four) times daily.    Marland Kitchen. lisinopril-hydrochlorothiazide (PRINZIDE,ZESTORETIC) 20-12.5 MG per tablet Take 1 tablet by mouth daily.    . cefUROXime (CEFTIN) 250 MG tablet Take 1 tablet (250 mg total) by mouth 2 (two) times daily with a meal. 8 tablet 0   No facility-administered medications prior to visit.    Filed Vitals:   05/20/14 0943  BP: 104/58  Pulse: 85  Height: 5' 11.5" (1.816 m)  Weight: 248 lb 3.2 oz (112.583 kg)  SpO2: 98%    Gen: Pleasant, well-nourished, in no distress,  normal affect  ENT: No lesions,  mouth clear,  oropharynx clear, no postnasal drip  Neck: No JVD, no TMG, no carotid bruits  Lungs: No use of accessory muscles, no dullness to percussion, clear without rales or rhonchi  Cardiovascular: RRR, heart sounds normal, no  murmur or gallops, no peripheral edema  Musculoskeletal: No deformities, no cyanosis or clubbing  Neuro: alert, non focal  Skin: Warm, no lesions or rashes   CT chest and abd/pelvis 05/11/14 --  COMPARISON: Chest radiograph of same date and back to 08/16/2007  FINDINGS: CT CHEST FINDINGS  Lungs/Pleura: Probable scarring in the right lower lobe laterally. This corresponds to the plain film abnormality. Example image 53 of series 7.  Bronchovascular nodularity which is bilateral and slightly upper lobe predominant.  No pleural fluid.  Heart/Mediastinum: Right supraclavicular node measures 1.2 cm on image 4.  No axillary adenopathy. Heart size upper normal, without pericardial effusion. LAD coronary artery atherosclerosis on image 29.  Extensive thoracic adenopathy. Right paratracheal nodal conglomerate measures 4.1 x 2.6 cm on image 21.  Subcarinal nodal mass measures 4.6 x 8.5 cm on image 32.  Right hilar node measures 2.5 cm on image 28. Left infrahilar adenopathy.  Index prevascular node measures 2.6 cm on image 20.  CT ABDOMEN AND PELVIS FINDINGS  Hepatobiliary: Too small to characterize right hepatic lobe lesion at 3 mm. Normal gallbladder, without biliary ductal dilatation.  Pancreas: Normal, without mass or pancreatic ductal dilatation.  Spleen: Normal, without splenomegaly.  Urinary tract: Normal adrenal glands. Interpolar right renal lesion which is too small to characterize.  Normal ureters and urinary bladder.  Stomach/Bowel: Normal stomach, without wall thickening. Proximal duodenum is displaced by adenopathy, but nonobstructed. Normal colon, appendix, and terminal ileum. Normal small bowel, without mesenteric adenopathy.  Vascular/Lymphatic: Mild atherosclerosis. Marked upper abdominal adenopathy. Gastrohepatic ligament node measures 3.7 x 5.0 cm on image 60  Portacaval node which measures 4.6 x 5.6 cm on image 70. No  pelvic adenopathy.  Reproductive: Normal prostate.  Other: No significant free fluid. Fat containing umbilical hernia.  Musculoskeletal: No acute osseous abnormality.  IMPRESSION: 1. Extensive thoracic and upper abdominal adenopathy with peribronchovascular pulmonary micro nodularity. Favor sarcoidosis. Lymphoma is felt less likely. 2. No acute superimposed process. The right lower lobe abnormality on chest radiograph likely represents an area of scarring versus inflammation related to presumed sarcoidosis. 3. Age advanced coronary artery atherosclerosis. Recommend assessment of coronary risk factors and consideration of medical Therapy.   Path 05/13/14 supraclavicular node Lymph node, needle/core biopsy, right supraclavicular - GRANULOMATOUS INFLAMMATION, SEE COMMENT.   Mediastinal adenopathy We reviewed his right supraclavicular nodal biopsy today. This shows non-caseating granulomas consistent with sarcoidosis. He will not require EBUS. We will plan to treat with pred x 4 weeks and then repeat his CT scan.

## 2014-06-08 DIAGNOSIS — R591 Generalized enlarged lymph nodes: Secondary | ICD-10-CM | POA: Insufficient documentation

## 2014-06-08 DIAGNOSIS — R590 Localized enlarged lymph nodes: Secondary | ICD-10-CM | POA: Insufficient documentation

## 2014-06-08 DIAGNOSIS — I10 Essential (primary) hypertension: Secondary | ICD-10-CM | POA: Insufficient documentation

## 2014-07-21 ENCOUNTER — Other Ambulatory Visit: Payer: PRIVATE HEALTH INSURANCE

## 2014-12-15 ENCOUNTER — Other Ambulatory Visit: Payer: Self-pay | Admitting: Family Medicine

## 2014-12-15 DIAGNOSIS — N289 Disorder of kidney and ureter, unspecified: Secondary | ICD-10-CM

## 2014-12-21 ENCOUNTER — Other Ambulatory Visit: Payer: PRIVATE HEALTH INSURANCE

## 2015-01-05 ENCOUNTER — Other Ambulatory Visit (HOSPITAL_COMMUNITY)
Admission: RE | Admit: 2015-01-05 | Discharge: 2015-01-05 | Disposition: A | Discharge status: Home / Self Care | Payer: PRIVATE HEALTH INSURANCE | Source: Ambulatory Visit | Attending: Otolaryngology | Admitting: Otolaryngology

## 2015-01-05 ENCOUNTER — Other Ambulatory Visit: Payer: Self-pay | Admitting: Otolaryngology

## 2015-01-05 DIAGNOSIS — R221 Localized swelling, mass and lump, neck: Secondary | ICD-10-CM | POA: Diagnosis present

## 2015-08-22 ENCOUNTER — Encounter (HOSPITAL_COMMUNITY): Payer: Self-pay | Admitting: Emergency Medicine

## 2015-08-22 ENCOUNTER — Observation Stay (HOSPITAL_COMMUNITY): Payer: Managed Care, Other (non HMO)

## 2015-08-22 ENCOUNTER — Observation Stay (HOSPITAL_COMMUNITY)
Admission: EM | Admit: 2015-08-22 | Discharge: 2015-08-22 | Disposition: A | Discharge status: Home / Self Care | Payer: Managed Care, Other (non HMO) | Attending: Emergency Medicine | Admitting: Emergency Medicine

## 2015-08-22 ENCOUNTER — Emergency Department (HOSPITAL_COMMUNITY): Payer: Managed Care, Other (non HMO)

## 2015-08-22 DIAGNOSIS — R55 Syncope and collapse: Secondary | ICD-10-CM | POA: Diagnosis present

## 2015-08-22 DIAGNOSIS — Z87891 Personal history of nicotine dependence: Secondary | ICD-10-CM | POA: Diagnosis not present

## 2015-08-22 DIAGNOSIS — M7532 Calcific tendinitis of left shoulder: Principal | ICD-10-CM | POA: Insufficient documentation

## 2015-08-22 DIAGNOSIS — R079 Chest pain, unspecified: Secondary | ICD-10-CM | POA: Diagnosis present

## 2015-08-22 DIAGNOSIS — D869 Sarcoidosis, unspecified: Secondary | ICD-10-CM | POA: Diagnosis not present

## 2015-08-22 DIAGNOSIS — I10 Essential (primary) hypertension: Secondary | ICD-10-CM | POA: Insufficient documentation

## 2015-08-22 DIAGNOSIS — Z79899 Other long term (current) drug therapy: Secondary | ICD-10-CM | POA: Diagnosis not present

## 2015-08-22 DIAGNOSIS — M25512 Pain in left shoulder: Secondary | ICD-10-CM

## 2015-08-22 LAB — BASIC METABOLIC PANEL
Anion gap: 8 (ref 5–15)
BUN: 15 mg/dL (ref 6–20)
CHLORIDE: 103 mmol/L (ref 101–111)
CO2: 26 mmol/L (ref 22–32)
CREATININE: 1.36 mg/dL — AB (ref 0.61–1.24)
Calcium: 9.2 mg/dL (ref 8.9–10.3)
GFR, EST NON AFRICAN AMERICAN: 59 mL/min — AB (ref >60)
Glucose, Bld: 95 mg/dL (ref 65–99)
Potassium: 4 mmol/L (ref 3.5–5.1)
SODIUM: 137 mmol/L (ref 135–145)

## 2015-08-22 LAB — CBC
HCT: 41.4 % (ref 39.0–52.0)
Hemoglobin: 13.3 g/dL (ref 13.0–17.0)
MCH: 29.1 pg (ref 26.0–34.0)
MCHC: 32.1 g/dL (ref 30.0–36.0)
MCV: 90.6 fL (ref 78.0–100.0)
PLATELETS: 299 10*3/uL (ref 150–400)
RBC: 4.57 MIL/uL (ref 4.22–5.81)
RDW: 14.4 % (ref 11.5–15.5)
WBC: 12.2 10*3/uL — AB (ref 4.0–10.5)

## 2015-08-22 LAB — I-STAT TROPONIN, ED: TROPONIN I, POC: 0 ng/mL (ref 0.00–0.08)

## 2015-08-22 MED ORDER — KETOROLAC TROMETHAMINE 60 MG/2ML IM SOLN
60.0000 mg | Freq: Once | INTRAMUSCULAR | Status: AC
  Administered 2015-08-22: 60 mg via INTRAMUSCULAR
  Filled 2015-08-22: qty 2

## 2015-08-22 NOTE — ED Notes (Signed)
Pt states that since last night he has had L sided chest pain. Denies SOB. More pain when he lifts his arm or moves. Alert and oriented.

## 2015-08-22 NOTE — ED Provider Notes (Signed)
CSN: [id number]     Arrival date & time 08/22/15  1932 History   First MD Initiated Contact with Patient 08/22/15 2040     Chief Complaint  Patient presents with  . Chest Pain     (Consider location/radiation/quality/duration/timing/severity/associated sxs/prior Treatment) HPI Comments: Patient with a history of hypertension and sarcoidosis presents with left-sided chest pain. He states it started last night and is been ongoing throughout the day. He states it sharp in nature and seems to radiate from his left shoulder joint. It lasts just a few seconds and goes away. He states it's brought on by certain arm movements of his left arm. If he keeps his arm still he doesn't have any pain. He states he may have had a little bit of shortness of breath earlier today but none throughout the day. There is no nausea vomiting or diaphoresis. He denies any significant cough or chest congestion. He denies any known history of heart disease. He is a former smoker but quit in 1990. He has no family history of early heart disease. He denies any known injuries to his shoulder. Denies any numbness or weakness to the hand. He did have some brief numbness to the fingers of the left hand diffusely this morning but hasn't had any other episodes.  Patient is a 50 y.o. male presenting with chest pain.  Chest Pain Associated symptoms: no abdominal pain, no back pain, no cough, no diaphoresis, no dizziness, no fatigue, no fever, no headache, no nausea, no numbness, no shortness of breath, not vomiting and no weakness     Past Medical History  Diagnosis Date  . Hypertension   . Sarcoidosis [facility])    Past Surgical History  Procedure Laterality Date  . No past surgeries     No family history on file. Social History  Substance Use Topics  . Smoking status: Former Smoker    Types: Cigars    Quit date: 05/11/1989  . Smokeless tobacco: Never Used  . Alcohol Use: Yes     Comment: occasional    Review of Systems   Constitutional: Negative for fever, chills, diaphoresis and fatigue.  HENT: Negative for congestion, rhinorrhea and sneezing.   Eyes: Negative.   Respiratory: Negative for cough, chest tightness and shortness of breath.   Cardiovascular: Positive for chest pain. Negative for leg swelling.  Gastrointestinal: Negative for nausea, vomiting, abdominal pain, diarrhea and blood in stool.  Genitourinary: Negative for frequency, hematuria, flank pain and difficulty urinating.  Musculoskeletal: Negative for back pain and arthralgias.  Skin: Negative for rash.  Neurological: Negative for dizziness, speech difficulty, weakness, numbness and headaches.      Allergies  Eggs or egg-derived products  Home Medications   Prior to Admission medications   Medication Sig Start Date End Date Taking? Authorizing Provider  acetaminophen (TYLENOL) 500 MG tablet Take 1,000 mg by mouth every 6 (six) hours as needed for mild pain, moderate pain or headache.   Yes Historical Provider, MD  amLODipine (NORVASC) 5 MG tablet Take 5 mg by mouth daily. 08/17/15  Yes Historical Provider, MD  Aspirin-Salicylamide-Caffeine (BC HEADACHE POWDER PO) Take 1 each by mouth every 8 (eight) hours as needed (pain).   Yes Historical Provider, MD   BP 127/80 mmHg  Pulse 88  Temp(Src) 98.6 F (37 C) (Oral)  Resp 18  SpO2 98% Physical Exam  Constitutional: He is oriented to person, place, and time. He appears well-developed and well-nourished.  HENT:  Head: Normocephalic and atraumatic.  Eyes: Pupils  are equal, round, and reactive to light.  Neck: Normal range of motion. Neck supple.  Cardiovascular: Normal rate, regular rhythm and normal heart sounds.   Pulmonary/Chest: Effort normal and breath sounds normal. No respiratory distress. He has no wheezes. He has no rales. He exhibits no tenderness.  Abdominal: Soft. Bowel sounds are normal. There is no tenderness. There is no rebound and no guarding.  Musculoskeletal: Normal  range of motion. He exhibits no edema.  Patient has pain shooting from his shoulder to his left chest on abduction of his left shoulder. There is some also mild tenderness on palpation over the insertion of the pectoralis there is no swelling. No erythema or warmth. No other palpable tenderness. No tenderness on palpation of the shoulder joint. He has normal sensation and motor function in the left arm. Radial pulses are intact.  Lymphadenopathy:    He has no cervical adenopathy.  Neurological: He is alert and oriented to person, place, and time.  Skin: Skin is warm and dry. No rash noted.  Psychiatric: He has a normal mood and affect.    ED Course  Procedures (including critical care time) Labs Review Labs Reviewed  BASIC METABOLIC PANEL - Abnormal; Notable for the following:    Creatinine, Ser 1.36 (*)    GFR calc non Af Amer 59 (*)    All other components within normal limits  CBC - Abnormal; Notable for the following:    WBC 12.2 (*)    All other components within normal limits  I-STAT TROPOININ, ED    Imaging Review Dg Chest 2 View  08/22/2015  CLINICAL DATA:  Chest pain and left arm numbness for 2 days. History of sarcoidosis. Initial encounter. EXAM: CHEST  2 VIEW COMPARISON:  CT chest and PA and lateral chest 05/11/2014. FINDINGS: There is mediastinal and bilateral hilar lymphadenopathy as seen on the prior examination. Heart size is normal. Lungs are clear. No pneumothorax or pleural effusion. IMPRESSION: Mediastinal and hilar lymphadenopathy consistent with history of sarcoidosis. No acute disease. Electronically Signed   By: Drusilla Kanner M.D.   On: 08/22/2015 20:52   Dg Shoulder Left  08/22/2015  CLINICAL DATA:  Acute onset of left-sided chest pain radiating to the left shoulder. Initial encounter. EXAM: LEFT SHOULDER - 2+ VIEW COMPARISON:  None. FINDINGS: There is no evidence of fracture or dislocation. The left humeral head is seated within the glenoid fossa. Note is made  of calcification along the distal insertion of the rotator cuff, reflecting calcific tendinitis. The acromioclavicular joint is unremarkable in appearance. No additional soft tissue abnormalities are seen. The visualized portions of the left lung are clear. IMPRESSION: 1. No evidence of fracture or dislocation. 2. Calcification along the distal insertion of the rotator cuff, reflecting calcific tendinitis. Electronically Signed   By: Roanna Raider M.D.   On: 08/22/2015 22:10   I have personally reviewed and evaluated these images and lab results as part of my medical decision-making.   EKG Interpretation None     ED ECG REPORT   Date: 08/22/2015  Rate: 91  Rhythm: normal sinus rhythm  QRS Axis: normal  Intervals: normal  ST/T Wave abnormalities: normal  Conduction Disutrbances:none  Narrative Interpretation:   Old EKG Reviewed: unchanged  I have personally reviewed the EKG tracing and agree with the computerized printout as noted.  MDM   Final diagnoses:  Shoulder pain, acute, left    Patient presents with pain to his left chest. It seems to be radiating from his  shoulder. It's worse with palpation around the shoulder. It's precipitated by movement of the left arm. It does not seem to be cardiac in nature. His EKG is without ischemic changes. His labs are unremarkable other than his creatinine is mildly elevated and his WBC count is mildly elevated. I did advise the patient of these findings. His x-ray of his chest doesn't show any acute findings. This shoulder x-ray shows some calcific tendinitis of the rotator cuff. Patient was given a shot of Toradol and feels much better after this. He was encouraged to follow-up with his PCP Dr. Clarene Duke within the next few days.    Rolan Bucco, MD 08/22/15 2237

## 2015-08-22 NOTE — Discharge Instructions (Signed)

## 2015-11-16 ENCOUNTER — Encounter (HOSPITAL_COMMUNITY): Payer: Self-pay | Admitting: Emergency Medicine

## 2015-11-16 ENCOUNTER — Emergency Department (HOSPITAL_COMMUNITY)
Admission: EM | Admit: 2015-11-16 | Discharge: 2015-11-16 | Disposition: A | Discharge status: Home / Self Care | Payer: Managed Care, Other (non HMO) | Attending: Emergency Medicine | Admitting: Emergency Medicine

## 2015-11-16 ENCOUNTER — Emergency Department (HOSPITAL_COMMUNITY): Payer: Managed Care, Other (non HMO)

## 2015-11-16 DIAGNOSIS — Z87891 Personal history of nicotine dependence: Secondary | ICD-10-CM | POA: Diagnosis not present

## 2015-11-16 DIAGNOSIS — Z862 Personal history of diseases of the blood and blood-forming organs and certain disorders involving the immune mechanism: Secondary | ICD-10-CM | POA: Diagnosis not present

## 2015-11-16 DIAGNOSIS — R319 Hematuria, unspecified: Secondary | ICD-10-CM | POA: Insufficient documentation

## 2015-11-16 DIAGNOSIS — I10 Essential (primary) hypertension: Secondary | ICD-10-CM | POA: Diagnosis not present

## 2015-11-16 DIAGNOSIS — Z79899 Other long term (current) drug therapy: Secondary | ICD-10-CM | POA: Insufficient documentation

## 2015-11-16 DIAGNOSIS — N50811 Right testicular pain: Secondary | ICD-10-CM | POA: Insufficient documentation

## 2015-11-16 DIAGNOSIS — N5089 Other specified disorders of the male genital organs: Secondary | ICD-10-CM | POA: Insufficient documentation

## 2015-11-16 DIAGNOSIS — R1031 Right lower quadrant pain: Secondary | ICD-10-CM | POA: Diagnosis not present

## 2015-11-16 LAB — CBC WITH DIFFERENTIAL/PLATELET
BASOS PCT: 0 %
Basophils Absolute: 0 10*3/uL (ref 0.0–0.1)
EOS PCT: 0 %
Eosinophils Absolute: 0 10*3/uL (ref 0.0–0.7)
HEMATOCRIT: 40.7 % (ref 39.0–52.0)
HEMOGLOBIN: 13.9 g/dL (ref 13.0–17.0)
Lymphocytes Relative: 19 %
Lymphs Abs: 3.4 10*3/uL (ref 0.7–4.0)
MCH: 30.2 pg (ref 26.0–34.0)
MCHC: 34.2 g/dL (ref 30.0–36.0)
MCV: 88.5 fL (ref 78.0–100.0)
MONO ABS: 1.5 10*3/uL — AB (ref 0.1–1.0)
MONOS PCT: 8 %
NEUTROS ABS: 13.5 10*3/uL — AB (ref 1.7–7.7)
Neutrophils Relative %: 73 %
Platelets: 250 10*3/uL (ref 150–400)
RBC: 4.6 MIL/uL (ref 4.22–5.81)
RDW: 13.3 % (ref 11.5–15.5)
WBC: 18.5 10*3/uL — ABNORMAL HIGH (ref 4.0–10.5)

## 2015-11-16 LAB — COMPREHENSIVE METABOLIC PANEL
ALBUMIN: 4.1 g/dL (ref 3.5–5.0)
ALK PHOS: 71 U/L (ref 38–126)
ALT: 31 U/L (ref 17–63)
ANION GAP: 9 (ref 5–15)
AST: 22 U/L (ref 15–41)
BUN: 12 mg/dL (ref 6–20)
CALCIUM: 9.1 mg/dL (ref 8.9–10.3)
CO2: 25 mmol/L (ref 22–32)
Chloride: 103 mmol/L (ref 101–111)
Creatinine, Ser: 1.35 mg/dL — ABNORMAL HIGH (ref 0.61–1.24)
GFR calc Af Amer: >60 mL/min (ref >60)
GFR calc non Af Amer: 59 mL/min — ABNORMAL LOW (ref >60)
GLUCOSE: 101 mg/dL — AB (ref 65–99)
Potassium: 3.8 mmol/L (ref 3.5–5.1)
SODIUM: 137 mmol/L (ref 135–145)
Total Bilirubin: 0.8 mg/dL (ref 0.3–1.2)
Total Protein: 8.9 g/dL — ABNORMAL HIGH (ref 6.5–8.1)

## 2015-11-16 LAB — URINALYSIS, ROUTINE W REFLEX MICROSCOPIC
Bilirubin Urine: NEGATIVE
Glucose, UA: NEGATIVE mg/dL
Ketones, ur: NEGATIVE mg/dL
NITRITE: POSITIVE — AB
PH: 6.5 (ref 5.0–8.0)
Protein, ur: 30 mg/dL — AB
SPECIFIC GRAVITY, URINE: 1.019 (ref 1.005–1.030)

## 2015-11-16 LAB — LIPASE, BLOOD: LIPASE: 18 U/L (ref 11–51)

## 2015-11-16 LAB — URINE MICROSCOPIC-ADD ON

## 2015-11-16 MED ORDER — HYDROMORPHONE HCL 1 MG/ML IJ SOLN
1.0000 mg | Freq: Once | INTRAMUSCULAR | Status: AC
  Administered 2015-11-16: 1 mg via INTRAVENOUS
  Filled 2015-11-16: qty 1

## 2015-11-16 MED ORDER — SODIUM CHLORIDE 0.9 % IV BOLUS (SEPSIS)
1000.0000 mL | Freq: Once | INTRAVENOUS | Status: AC
  Administered 2015-11-16: 1000 mL via INTRAVENOUS

## 2015-11-16 MED ORDER — CIPROFLOXACIN HCL 500 MG PO TABS: 500.0000 mg | ORAL_TABLET | Freq: Two times a day (BID) | ORAL | Status: DC

## 2015-11-16 MED ORDER — DEXTROSE 5 % IV SOLN
1.0000 g | Freq: Once | INTRAVENOUS | Status: AC
  Administered 2015-11-16: 1 g via INTRAVENOUS
  Filled 2015-11-16: qty 10

## 2015-11-16 MED ORDER — OXYCODONE-ACETAMINOPHEN 5-325 MG PO TABS: 2.0000 | ORAL_TABLET | ORAL | Status: DC | PRN

## 2015-11-16 NOTE — ED Provider Notes (Addendum)
Pt seen and evaluated.  C/o right inguinal and testicle pain,  Normal testicle exam.  Normal US.  Gross hematuria starting this morning per patient. No frank dysuria. No previous prostate infections. No flank pain. Would recommend CT to rule out stone or active process. Maybe prostatitis the patient complains his pain is worse sitting, relieved with standing or walking. His negative CT would recommend pain control, antibiotics, culture, and close follow-up.  20:00:  Discussed the case with Dr. Lehman PromMark Little in of [facility]. Patient continues to make urine. Symptoms are well-controlled after one dose of IV medications. Screening is a baseline versus February of this year. Dr. Larita FifeLynn felt that he could be discharged to continue on antibiotics. However, elect to see him in the office tomorrow morning. He's asked the patient to be nothing by mouth after midnight. She has worsening renal function, decreasing urine, uncontrolled pain, or other worsening findings he may need intervention at that time. I discussed this at length with patient.   Rolland PorterMark Storey Stangeland, MD 11/16/15 [id number]  Rolland PorterMark Leor Whyte, MD 11/16/15 [phone]

## 2015-11-16 NOTE — ED Notes (Signed)
Pt reports right groin area radiating to lower abdomen. Also reports hematuria, denies dysuria. Reports had incident at work while lifting boxes . Alert and oriented x 4. denies further injury.

## 2015-11-16 NOTE — ED Notes (Signed)
Pt transported to US

## 2015-11-16 NOTE — Discharge Instructions (Signed)
Please call Dr. Margrett Rudttelin's office tomorrow at [facility]9AM for an appointment in the morning. You can eat until midnight tonight but please do not eat or drink anything after that since he may want to do surgery tomorrow. A prescription for antibiotics and pain medicine have been provided for you.

## 2015-11-16 NOTE — ED Provider Notes (Signed)
CSN: [id number]     Arrival date & time 11/16/15  1517 History   First MD Initiated Contact with Patient 11/16/15 1522     Chief Complaint  Patient presents with  . Groin Pain   HPI Comments: 50 year old male presents with acute onset of right-sided groin pain for the past day. PMH of HTN and Sarcoidosis. He states he was lifting heavy boxes about 60-70 pounds yesterday at work when he got home he started to feel tingling on the right side. He took an Ibuprofen at the time which alleviated the pain. The pain intensified overnight and he went to UC earlier today. They were concerned for testicular torsion and sent him here for further evaluation. His pain is in the right groin and radiates up to the right lower quadrant. It is constant. Reports associated hematuria which is painless however denies discharge. He denies previous abdominal surgeries. Denies fever, chills, chest pain, shortness of breath, upper abdominal pain, nausea, vomiting, diarrhea. Wife is at bedside.   Patient is a 50 y.o. male presenting with groin pain.  Groin Pain Associated symptoms include abdominal pain. Pertinent negatives include no chest pain, fever, nausea or vomiting.    Past Medical History  Diagnosis Date  . Hypertension   . Sarcoidosis [facility])    Past Surgical History  Procedure Laterality Date  . No past surgeries     No family history on file. Social History  Substance Use Topics  . Smoking status: Former Smoker    Types: Cigars    Quit date: 05/11/1989  . Smokeless tobacco: Never Used  . Alcohol Use: Yes     Comment: occasional    Review of Systems  Constitutional: Negative for fever.  Respiratory: Negative for shortness of breath.   Cardiovascular: Negative for chest pain.  Gastrointestinal: Positive for abdominal pain. Negative for nausea, vomiting and constipation.  Genitourinary: Positive for hematuria, scrotal swelling and testicular pain. Negative for dysuria, frequency, flank pain,  discharge, penile swelling, difficulty urinating, genital sores and penile pain.  All other systems reviewed and are negative.   Allergies  Eggs or egg-derived products  Home Medications   Prior to Admission medications   Medication Sig Start Date End Date Taking? Authorizing Provider  acetaminophen (TYLENOL) 500 MG tablet Take 1,000 mg by mouth every 6 (six) hours as needed for mild pain, moderate pain or headache.   Yes Historical Provider, MD  amLODipine (NORVASC) 5 MG tablet Take 5 mg by mouth daily. 08/17/15  Yes Historical Provider, MD  Aspirin-Salicylamide-Caffeine (BC HEADACHE POWDER PO) Take 1 each by mouth every 8 (eight) hours as needed (pain).   Yes Historical Provider, MD  Ibuprofen 200 MG CAPS Take 400 mg by mouth every 4 (four) hours as needed (pain.).   Yes Historical Provider, MD   BP 144/81 mmHg  Pulse 74  Temp(Src) 98.7 F (37.1 C) (Oral)  Resp 18  SpO2 100%   Physical Exam  Constitutional: He is oriented to person, place, and time. He appears well-developed and well-nourished. No distress.  HENT:  Head: Normocephalic and atraumatic.  Eyes: Conjunctivae are normal. Pupils are equal, round, and reactive to light. Right eye exhibits no discharge. Left eye exhibits no discharge. No scleral icterus.  Neck: Normal range of motion.  Cardiovascular: Normal rate and regular rhythm.  Exam reveals no gallop and no friction rub.   No murmur heard. Pulmonary/Chest: Effort normal and breath sounds normal. No respiratory distress. He has no wheezes. He has no rales. He exhibits  no tenderness.  Abdominal: Soft. Bowel sounds are normal. He exhibits no distension and no mass. There is tenderness. There is no rebound, no guarding and no CVA tenderness. No hernia.  RLQ tenderness  Genitourinary:  Chaperone present during exam. No inguinal lymphadenopathy or inguinal hernia noted. Normal uncircumcised penis free of lesion or rash. L testicle non-tender. R testicle is tender with  negative Phren's sign. Normal scrotal appearance. No obvious discharge noted. Patient states pain is testicle is more intense than pain in RLQ of abdomen   Neurological: He is alert and oriented to person, place, and time.  Skin: Skin is warm and dry.  Psychiatric: He has a normal mood and affect.    ED Course  Procedures (including critical care time) Labs Review Labs Reviewed  COMPREHENSIVE METABOLIC PANEL - Abnormal; Notable for the following:    Glucose, Bld 101 (*)    Creatinine, Ser 1.35 (*)    Total Protein 8.9 (*)    GFR calc non Af Amer 59 (*)    All other components within normal limits  CBC WITH DIFFERENTIAL/PLATELET - Abnormal; Notable for the following:    WBC 18.5 (*)    Neutro Abs 13.5 (*)    Monocytes Absolute 1.5 (*)    All other components within normal limits  URINALYSIS, ROUTINE W REFLEX MICROSCOPIC (NOT [facility]) - Abnormal; Notable for the following:    APPearance TURBID (*)    Hgb urine dipstick LARGE (*)    Protein, ur 30 (*)    Nitrite POSITIVE (*)    Leukocytes, UA LARGE (*)    All other components within normal limits  URINE MICROSCOPIC-ADD ON - Abnormal; Notable for the following:    Squamous Epithelial / LPF 0-5 (*)    Bacteria, UA MANY (*)    All other components within normal limits  LIPASE, BLOOD    Imaging Review [place] Scrotum  11/16/2015  CLINICAL DATA:  Right scrotal/groin pain for 1 day. History of sarcoidosis. EXAM: SCROTAL ULTRASOUND DOPPLER ULTRASOUND OF THE TESTICLES TECHNIQUE: Complete ultrasound examination of the testicles, epididymis, and other scrotal structures was performed. Color and spectral Doppler ultrasound were also utilized to evaluate blood flow to the testicles. COMPARISON:  None. FINDINGS: Right testicle Measurements: 3.8 x 2.5 x 2.9 cm. There are multiple (greater than 10) tiny 2-3 mm hypodense lesions scattered throughout the right testis. Left testicle Measurements: 4.0 x 3.0 x 3.1 cm. There are multiple (greater than 10)  tiny 2-3 mm hypodense lesions scattered throughout the left testis. Right epididymis: Normal in size and appearance. No mass. No hyperemia. Left epididymis: Tiny simple 5 x 4 x 4 mm cyst versus spermatocele in the left epididymal head. Otherwise normal in size and appearance. Hydrocele:  Small bilateral hydroceles. Varicocele:  None visualized. Pulsed Doppler interrogation of both testes demonstrates normal low resistance arterial and venous waveforms bilaterally. IMPRESSION: 1. No evidence of testicular torsion. 2. Numerous tiny 2-3 mm hypodense lesions scattered throughout the bilateral testes, nonspecific, probably due to the patient's history of sarcoidosis. A follow-up scrotal sonogram in 3-6 months would be useful to document stability. 3. Tiny simple cyst versus spermatocele in the left epididymal head. No findings specific for acute epididymitis. 4. Small bilateral hydroceles. Electronically Signed   By: Delbert PhenixJason A Poff M.D.   On: 11/16/2015 17:16   [place] Art/ven Flow Abd Pelv Doppler Limited  11/16/2015  CLINICAL DATA:  Right scrotal/groin pain for 1 day. History of sarcoidosis. EXAM: SCROTAL ULTRASOUND DOPPLER ULTRASOUND OF THE TESTICLES TECHNIQUE:  Complete ultrasound examination of the testicles, epididymis, and other scrotal structures was performed. Color and spectral Doppler ultrasound were also utilized to evaluate blood flow to the testicles. COMPARISON:  None. FINDINGS: Right testicle Measurements: 3.8 x 2.5 x 2.9 cm. There are multiple (greater than 10) tiny 2-3 mm hypodense lesions scattered throughout the right testis. Left testicle Measurements: 4.0 x 3.0 x 3.1 cm. There are multiple (greater than 10) tiny 2-3 mm hypodense lesions scattered throughout the left testis. Right epididymis: Normal in size and appearance. No mass. No hyperemia. Left epididymis: Tiny simple 5 x 4 x 4 mm cyst versus spermatocele in the left epididymal head. Otherwise normal in size and appearance. Hydrocele:  Small  bilateral hydroceles. Varicocele:  None visualized. Pulsed Doppler interrogation of both testes demonstrates normal low resistance arterial and venous waveforms bilaterally. IMPRESSION: 1. No evidence of testicular torsion. 2. Numerous tiny 2-3 mm hypodense lesions scattered throughout the bilateral testes, nonspecific, probably due to the patient's history of sarcoidosis. A follow-up scrotal sonogram in 3-6 months would be useful to document stability. 3. Tiny simple cyst versus spermatocele in the left epididymal head. No findings specific for acute epididymitis. 4. Small bilateral hydroceles. Electronically Signed   By: Delbert Phenix M.D.   On: 11/16/2015 17:16   Ct Renal Stone Study  11/16/2015  CLINICAL DATA:  Right groin pain radiating to the lower abdomen. Hematuria. EXAM: CT ABDOMEN AND PELVIS WITHOUT CONTRAST TECHNIQUE: Multidetector CT imaging of the abdomen and pelvis was performed following the standard protocol without IV contrast. COMPARISON:  CT abdomen pelvis - 05/11/2014 FINDINGS: Lower chest: Limited visualization of the lower thorax is negative for focal airspace opacity or pleural effusion. There are innumerable punctate (2 mm) nodules which demonstrated apparent perilymphatic distribution within in the imaged bilateral lower lobes and inferior segments of the right middle lobe and lingula (representative images 14 and 17, series 4). Unchanged geographic subsegmental atelectasis/scar within the right costophrenic angle (image 30, series 4, similar to the 04/2014 examination. No pleural effusion or focal airspace opacity. Normal heart size.  No pericardial effusion. Hepatobiliary: Normal hepatic contour. Normal noncontrast appearance of the gallbladder given degree distention. No radiopaque gallstones. No ascites. Pancreas: Normal noncontrast appearance of the pancreas Spleen: Normal noncontrast appearance of the spleen no evidence splenomegaly. Incidental spina small splenule. Adrenals/Urinary  Tract: There is a punctate (approximately 7 mm) stone within the superior aspect the right ureter which results in mild upstream ureterectasis and pelvicaliectasis (coronal image 86, series 5). There are 2 punctate stones within the superior aspect of the left ureter with dominant stone measuring 6 mm in diameter in small are more cranially located stone measuring 3 mm in diameter (both stones seen on coronal image 90, series 5) with associated mild upstream left-sided ureterectasis and pelvicaliectasis. No additional renal stones identified. Several punctate phleboliths are seen within the left gonadal vein. Several phleboliths are seen with the lower pelvis bilaterally. Normal noncontrast appearance of the urinary bladder given degree distention. Normal noncontrast appearance of the bilateral adrenal glands. Stomach/Bowel: The colon is underdistended. Note is made of 2 partially calcified diverticulum within in the cecum. Bowel is otherwise normal in course and caliber without wall thickening. Normal noncontrast appearance of the terminal ileum and retrocecal appendix. No pneumoperitoneum, pneumatosis or portal venous gas. Vascular/Lymphatic: Normal caliber of the abdominal aorta. Pericardiac and retroperitoneal lymph node adenopathy has decreased in the interval with index pericardiac lymph node currently 8 mm in diameter (image 3, series 2), previously, 1.1 cm, index  gastrohepatic ligament lymph node now measuring 1.4 cm in diameter (image 18, series 2), previously, 2 cm, and index pre caval lymph node measures 2.7 cm in greatest short axis diameter (image 27, series 2), previously, 4 cm. Reproductive: Dystrophic calcifications within normal sized prostate gland. No free fluid in the pelvic cul-de-sac. Other: Small (approximately 2.7 x 3.5 cm mesenteric fat containing periumbilical hernia. Musculoskeletal: No acute or aggressive osseous abnormalities. 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 IMPRESSION: 1. Bilateral punctate ureteral stones result in mild bilateral upstream ureterectasis and pelvicaliectasis. Right-sided ureteral stone measures 7 mm in diameter. There are 2 left-sided renal stones measuring 6 mm and 3 mm in diameter respectively. 2. No evidence of nonobstructing nephrolithiasis. 3. Improved retroperitoneal and pericardial adenopathy suggestive of improved sarcoidosis. 4. Innumerable punctate (approximately 2 mm) nodules within the imaged lung bases with suspected perilymphatic distribution compatible with provided history of sarcoidosis. No evidence of fibrosis. 5. 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 Electronically Signed   By: Simonne Come M.D.   On: 11/16/2015 19:20   I have personally reviewed and evaluated these images and lab results as part of my medical decision-making.   EKG Interpretation None      MDM   Final diagnoses:  Right groin pain   50 year old male with Sarcoid and HTN presents with acute onset of R groin pain.  Vitals are stable. He is afebrile. PE was concerning for possible torsion. [place] of scrotum and doppler were neg for torsion. Dialudid given for pain management with adequate relief. CBC remarkable for leukocytosis of 18.5. CMP remarkable for SCr of 1.35 which is around his baseline. Lipase normal. UA shows large Hgb, protein, positive nitrites, large leukocytes, many bacteria, and numerous RBC and WBC. CT of abdomen and pelvis showed bilateral obstructing stones and possible avascular necrosis of femoral head. Another dose of Dilaudid given along with 1L IVF.  Shared visit with Dr. Fayrene Fearing. Dr.Ottelin with Urology consulted. He states that if he afebrile, his pain is controlled, and his renal function is stable then he can follow up in the office tomorrow morning. Pt  notified and advised to be NPO after midnight. Pain medication and rx for Cipro given.   Patient is NAD, non-toxic, with stable VS. Patient is informed of clinical course, understands medical decision making process, and agrees with plan. Opportunity for questions provided and all questions answered. He will call Dr. Margrett Rud office in the morning.   Bethel Born, PA-C 11/16/15 2106  Rolland Porter, MD 11/20/15 [phone]

## 2015-11-17 ENCOUNTER — Encounter (HOSPITAL_BASED_OUTPATIENT_CLINIC_OR_DEPARTMENT_OTHER): Payer: Self-pay

## 2015-11-17 ENCOUNTER — Observation Stay (HOSPITAL_BASED_OUTPATIENT_CLINIC_OR_DEPARTMENT_OTHER)
Admission: RE | Admit: 2015-11-17 | Discharge: 2015-11-19 | Disposition: A | Discharge status: Home / Self Care | Payer: Managed Care, Other (non HMO) | Source: Ambulatory Visit | Attending: Urology | Admitting: Urology

## 2015-11-17 ENCOUNTER — Ambulatory Visit (HOSPITAL_BASED_OUTPATIENT_CLINIC_OR_DEPARTMENT_OTHER): Payer: Managed Care, Other (non HMO) | Admitting: Anesthesiology

## 2015-11-17 ENCOUNTER — Other Ambulatory Visit: Payer: Self-pay | Admitting: Urology

## 2015-11-17 ENCOUNTER — Ambulatory Visit (HOSPITAL_COMMUNITY): Payer: Managed Care, Other (non HMO)

## 2015-11-17 ENCOUNTER — Encounter (HOSPITAL_COMMUNITY)
Admission: RE | Disposition: A | Discharge status: Home / Self Care | Payer: Self-pay | Source: Ambulatory Visit | Attending: Urology

## 2015-11-17 DIAGNOSIS — N201 Calculus of ureter: Secondary | ICD-10-CM | POA: Diagnosis present

## 2015-11-17 DIAGNOSIS — Z87891 Personal history of nicotine dependence: Secondary | ICD-10-CM | POA: Insufficient documentation

## 2015-11-17 DIAGNOSIS — I10 Essential (primary) hypertension: Secondary | ICD-10-CM | POA: Insufficient documentation

## 2015-11-17 DIAGNOSIS — D869 Sarcoidosis, unspecified: Secondary | ICD-10-CM | POA: Diagnosis not present

## 2015-11-17 DIAGNOSIS — R509 Fever, unspecified: Secondary | ICD-10-CM | POA: Insufficient documentation

## 2015-11-17 DIAGNOSIS — Z79899 Other long term (current) drug therapy: Secondary | ICD-10-CM | POA: Insufficient documentation

## 2015-11-17 HISTORY — PX: CYSTOSCOPY W/ URETERAL STENT PLACEMENT: SHX1429

## 2015-11-17 LAB — POCT I-STAT, CHEM 8
BUN: 12 mg/dL (ref 6–20)
Calcium, Ion: 1.19 mmol/L (ref 1.12–1.23)
Chloride: 103 mmol/L (ref 101–111)
Creatinine, Ser: 1.1 mg/dL (ref 0.61–1.24)
Glucose, Bld: 100 mg/dL — ABNORMAL HIGH (ref 65–99)
HEMATOCRIT: 45 % (ref 39.0–52.0)
HEMOGLOBIN: 15.3 g/dL (ref 13.0–17.0)
Potassium: 3.8 mmol/L (ref 3.5–5.1)
SODIUM: 140 mmol/L (ref 135–145)
TCO2: 23 mmol/L (ref 0–100)

## 2015-11-17 LAB — CBC
HEMATOCRIT: 37.3 % — AB (ref 39.0–52.0)
Hemoglobin: 12.6 g/dL — ABNORMAL LOW (ref 13.0–17.0)
MCH: 29.9 pg (ref 26.0–34.0)
MCHC: 33.8 g/dL (ref 30.0–36.0)
MCV: 88.4 fL (ref 78.0–100.0)
PLATELETS: 241 10*3/uL (ref 150–400)
RBC: 4.22 MIL/uL (ref 4.22–5.81)
RDW: 13.3 % (ref 11.5–15.5)
WBC: 15.8 10*3/uL — AB (ref 4.0–10.5)

## 2015-11-17 LAB — BASIC METABOLIC PANEL
ANION GAP: 9 (ref 5–15)
BUN: 12 mg/dL (ref 6–20)
CALCIUM: 8.8 mg/dL — AB (ref 8.9–10.3)
CO2: 23 mmol/L (ref 22–32)
CREATININE: 1.34 mg/dL — AB (ref 0.61–1.24)
Chloride: 107 mmol/L (ref 101–111)
GFR calc Af Amer: >60 mL/min (ref >60)
GFR, EST NON AFRICAN AMERICAN: 60 mL/min — AB (ref >60)
GLUCOSE: 99 mg/dL (ref 65–99)
Potassium: 3.8 mmol/L (ref 3.5–5.1)
Sodium: 139 mmol/L (ref 135–145)

## 2015-11-17 SURGERY — CYSTOSCOPY, WITH RETROGRADE PYELOGRAM AND URETERAL STENT INSERTION
Anesthesia: General | Site: Ureter | Laterality: Bilateral

## 2015-11-17 MED ORDER — LACTATED RINGERS IV SOLN
INTRAVENOUS | Status: DC
  Administered 2015-11-17: 12:00:00 via INTRAVENOUS
  Filled 2015-11-17: qty 1000

## 2015-11-17 MED ORDER — MIDAZOLAM HCL 2 MG/2ML IJ SOLN
INTRAMUSCULAR | Status: AC
  Filled 2015-11-17: qty 2

## 2015-11-17 MED ORDER — DEXTROSE 5 % IV SOLN
INTRAVENOUS | Status: AC
  Filled 2015-11-17: qty 50

## 2015-11-17 MED ORDER — DEXAMETHASONE SODIUM PHOSPHATE 10 MG/ML IJ SOLN
INTRAMUSCULAR | Status: AC
  Filled 2015-11-17: qty 1

## 2015-11-17 MED ORDER — SODIUM CHLORIDE 0.9 % IV SOLN
INTRAVENOUS | Status: DC
Start: 2015-11-17 — End: 2015-11-18
  Administered 2015-11-17 – 2015-11-18 (×2): via INTRAVENOUS

## 2015-11-17 MED ORDER — FENTANYL CITRATE (PF) 100 MCG/2ML IJ SOLN
INTRAMUSCULAR | Status: AC
  Filled 2015-11-17: qty 2

## 2015-11-17 MED ORDER — BELLADONNA ALKALOIDS-OPIUM 16.2-60 MG RE SUPP: 1.0000 | Freq: Four times a day (QID) | RECTAL | Status: DC | PRN

## 2015-11-17 MED ORDER — DEXTROSE 5 % IV SOLN
2.0000 g | INTRAVENOUS | Status: AC
  Administered 2015-11-17: 2 g via INTRAVENOUS
  Filled 2015-11-17: qty 2

## 2015-11-17 MED ORDER — SENNA 8.6 MG PO TABS
1.0000 | ORAL_TABLET | Freq: Two times a day (BID) | ORAL | Status: DC
Start: 2015-11-17 — End: 2015-11-19
  Administered 2015-11-17 – 2015-11-18 (×2): 8.6 mg via ORAL
  Filled 2015-11-17 (×4): qty 1

## 2015-11-17 MED ORDER — LACTATED RINGERS IV SOLN
INTRAVENOUS | Status: DC
Start: 2015-11-17 — End: 2015-11-17
  Administered 2015-11-17 (×2): via INTRAVENOUS
  Filled 2015-11-17: qty 1000

## 2015-11-17 MED ORDER — CEFTRIAXONE SODIUM 2 G IJ SOLR
INTRAMUSCULAR | Status: AC
  Filled 2015-11-17: qty 2

## 2015-11-17 MED ORDER — MEPERIDINE HCL 25 MG/ML IJ SOLN
6.2500 mg | INTRAMUSCULAR | Status: DC | PRN
  Filled 2015-11-17: qty 1

## 2015-11-17 MED ORDER — MIDAZOLAM HCL 5 MG/5ML IJ SOLN
INTRAMUSCULAR | Status: DC | PRN
  Administered 2015-11-17: 2 mg via INTRAVENOUS

## 2015-11-17 MED ORDER — DIPHENHYDRAMINE HCL 12.5 MG/5ML PO ELIX: 12.5000 mg | ORAL_SOLUTION | Freq: Four times a day (QID) | ORAL | Status: DC | PRN

## 2015-11-17 MED ORDER — HYDROMORPHONE HCL 1 MG/ML IJ SOLN
0.5000 mg | INTRAMUSCULAR | Status: DC | PRN
Start: 2015-11-17 — End: 2015-11-19

## 2015-11-17 MED ORDER — FENTANYL CITRATE (PF) 100 MCG/2ML IJ SOLN
INTRAMUSCULAR | Status: DC | PRN
  Administered 2015-11-17 (×2): 25 ug via INTRAVENOUS
  Administered 2015-11-17: 50 ug via INTRAVENOUS

## 2015-11-17 MED ORDER — ONDANSETRON HCL 4 MG/2ML IJ SOLN: 4.0000 mg | INTRAMUSCULAR | Status: DC | PRN

## 2015-11-17 MED ORDER — KETOROLAC TROMETHAMINE 30 MG/ML IJ SOLN
INTRAMUSCULAR | Status: DC | PRN
  Administered 2015-11-17: 30 mg via INTRAVENOUS

## 2015-11-17 MED ORDER — DEXTROSE 5 % IV SOLN
2.0000 g | INTRAVENOUS | Status: DC
  Filled 2015-11-17: qty 2

## 2015-11-17 MED ORDER — ONDANSETRON HCL 4 MG/2ML IJ SOLN
INTRAMUSCULAR | Status: AC
  Filled 2015-11-17: qty 2

## 2015-11-17 MED ORDER — OXYCODONE-ACETAMINOPHEN 5-325 MG PO TABS
1.0000 | ORAL_TABLET | ORAL | Status: DC | PRN
  Filled 2015-11-17: qty 2

## 2015-11-17 MED ORDER — PROMETHAZINE HCL 25 MG/ML IJ SOLN
6.2500 mg | INTRAMUSCULAR | Status: DC | PRN
  Filled 2015-11-17: qty 1

## 2015-11-17 MED ORDER — DIPHENHYDRAMINE HCL 50 MG/ML IJ SOLN: 12.5000 mg | Freq: Four times a day (QID) | INTRAMUSCULAR | Status: DC | PRN

## 2015-11-17 MED ORDER — SODIUM CHLORIDE 0.9 % IR SOLN
Status: DC | PRN
  Administered 2015-11-17: 4000 mL

## 2015-11-17 MED ORDER — ZOLPIDEM TARTRATE 5 MG PO TABS: 5.0000 mg | ORAL_TABLET | Freq: Every evening | ORAL | Status: DC | PRN

## 2015-11-17 MED ORDER — DEXAMETHASONE SODIUM PHOSPHATE 4 MG/ML IJ SOLN
INTRAMUSCULAR | Status: DC | PRN
  Administered 2015-11-17: 10 mg via INTRAVENOUS

## 2015-11-17 MED ORDER — HYDROMORPHONE HCL 1 MG/ML IJ SOLN
0.2500 mg | INTRAMUSCULAR | Status: DC | PRN
  Filled 2015-11-17: qty 1

## 2015-11-17 MED ORDER — PROPOFOL 10 MG/ML IV BOLUS
INTRAVENOUS | Status: DC | PRN
  Administered 2015-11-17: 250 mg via INTRAVENOUS

## 2015-11-17 MED ORDER — LIDOCAINE HCL (CARDIAC) 20 MG/ML IV SOLN
INTRAVENOUS | Status: DC | PRN
  Administered 2015-11-17: 100 mg via INTRAVENOUS

## 2015-11-17 MED ORDER — ONDANSETRON HCL 4 MG/2ML IJ SOLN
INTRAMUSCULAR | Status: DC | PRN
  Administered 2015-11-17: 4 mg via INTRAVENOUS

## 2015-11-17 MED ORDER — PROPOFOL 10 MG/ML IV BOLUS
INTRAVENOUS | Status: AC
  Filled 2015-11-17: qty 40

## 2015-11-17 SURGICAL SUPPLY — 26 items
BAG DRAIN URO-CYSTO SKYTR STRL (DRAIN) ×2 IMPLANT
BAG DRN UROCATH (DRAIN) ×1
BAG URINE DRAINAGE (UROLOGICAL SUPPLIES) ×1 IMPLANT
CANISTER SUCT LVC 12 LTR MEDI- (MISCELLANEOUS) IMPLANT
CATH FOLEY 2WAY SLVR  5CC 16FR (CATHETERS) ×1
CATH FOLEY 2WAY SLVR 5CC 16FR (CATHETERS) IMPLANT
CATH INTERMIT  6FR 70CM (CATHETERS) ×1 IMPLANT
CLOTH BEACON ORANGE TIMEOUT ST (SAFETY) ×2 IMPLANT
GLOVE BIO SURGEON STRL SZ 6.5 (GLOVE) ×1 IMPLANT
GLOVE BIO SURGEON STRL SZ8 (GLOVE) ×2 IMPLANT
GLOVE INDICATOR 6.5 STRL GRN (GLOVE) ×1 IMPLANT
GOWN STRL REUS W/ TWL LRG LVL3 (GOWN DISPOSABLE) ×1 IMPLANT
GOWN STRL REUS W/ TWL XL LVL3 (GOWN DISPOSABLE) ×1 IMPLANT
GOWN STRL REUS W/TWL LRG LVL3 (GOWN DISPOSABLE) ×2
GOWN STRL REUS W/TWL XL LVL3 (GOWN DISPOSABLE) ×2
GUIDEWIRE ANG ZIPWIRE 038X150 (WIRE) ×1 IMPLANT
GUIDEWIRE STR DUAL SENSOR (WIRE) ×1 IMPLANT
HOLDER FOLEY CATH W/STRAP (MISCELLANEOUS) ×1 IMPLANT
IV NS IRRIG 3000ML ARTHROMATIC (IV SOLUTION) ×2 IMPLANT
KIT ROOM TURNOVER WOR (KITS) ×2 IMPLANT
MANIFOLD NEPTUNE II (INSTRUMENTS) ×1 IMPLANT
PACK CYSTO (CUSTOM PROCEDURE TRAY) ×2 IMPLANT
STENT URET 6FRX26 CONTOUR (STENTS) ×2 IMPLANT
SYRINGE 10CC LL (SYRINGE) ×2 IMPLANT
TUBE CONNECTING 12X1/4 (SUCTIONS) ×1 IMPLANT
TUBE FEEDING 8FR 16IN STR KANG (MISCELLANEOUS) IMPLANT

## 2015-11-17 NOTE — Progress Notes (Signed)
Subjective: The patient is doing well.  No complaints.  Objective: Vital signs in last 24 hours: Temp:  [97.8 F (36.6 C)-99.6 F (37.6 C)] 97.8 F (36.6 C) (05/05 1615) Pulse Rate:  [68-91] 71 (05/05 1615) Resp:  [14-22] 16 (05/05 1615) BP: (111-150)/(66-87) 128/78 mmHg (05/05 1615) SpO2:  [97 %-100 %] 99 % (05/05 1615) Weight:  [122.471 kg (270 lb)] 122.471 kg (270 lb) (05/05 1615)  Intake/Output from previous day:   Intake/Output this shift: Total I/O In: 1200 [I.V.:1200] Out: 100 [Urine:100]  Physical Exam:  General: Alert and oriented. Abdomen: Soft, Nondistended.   Lab Results:  Recent Labs  11/16/15 1626 11/17/15 1231 11/17/15 1500  HGB 13.9 15.3 12.6*  HCT 40.7 45.0 37.3*    Assessment/Plan: POD#1 s/p bilateral stents, doing well.  Pain control Regular diet D/c foley Med lock Transition to oral antibiotics, monitor for fever      Juliane Pooteter S Marisol Giambra 11/17/2015, 4:34 PM

## 2015-11-17 NOTE — Anesthesia Procedure Notes (Signed)
Procedure Name: LMA Insertion Date/Time: 11/17/2015 2:05 PM Performed by: Jessica PriestBEESON, Derek Betancur C Pre-anesthesia Checklist: Patient identified, Emergency Drugs available, Suction available and Patient being monitored Patient Re-evaluated:Patient Re-evaluated prior to inductionOxygen Delivery Method: Circle System Utilized Preoxygenation: Pre-oxygenation with 100% oxygen Intubation Type: IV induction Ventilation: Mask ventilation without difficulty LMA: LMA inserted LMA Size: 5.0 Number of attempts: 1 Airway Equipment and Method: Bite block Placement Confirmation: positive ETCO2 Tube secured with: Tape Dental Injury: Teeth and Oropharynx as per pre-operative assessment

## 2015-11-17 NOTE — Progress Notes (Signed)
PHARMACY NOTE -  ANTIBIOTIC RENAL DOSE ADJUSTMENT    Request received for Pharmacy to assist with antibiotic renal dose adjustment.   Patient has been initiated on Rocephin for UTI (obesity dosing).  Rocephin does not require renal adjustment  Will sign off at this time.  Please reconsult if a change in clinical status warrants re-evaluation of dosage.  Bernadene Personrew Genea Rheaume, PharmD, BCPS Pager: [phone] 11/17/2015, 5:17 PM

## 2015-11-17 NOTE — Discharge Summary (Signed)
Date of admission: 11/17/2015  Date of discharge: 11/19/2015  Admission diagnosis: bilateral ureteral stones  Discharge diagnosis: same  Secondary diagnoses: fever  History and Physical: For full details, please see admission history and physical. Briefly, Derek Bennett is a 50 y.o. year old patient with bilateral stones and fever.  Procedure(s): BILATERAL URETERAL STENT PLACEMENT, BIL RETROGRADE, CYSTOSCOPY    Hospital Course: The patient underwent the above procedure. They tolerated surgery well and were transferred to the floor postoperatively. Their diet was slowly advanced and at the time of discharge they were tolerating a regular diet. At the time of discharge their pain was controlled with PO medications and they were ambulating without difficulty. Their foley was removed prior to discharge and they were voiding spontaneously. They were transitioned to oral antibiotics and monitored for 24 hours with no fevers. They were discharged home on post op day 2.    Laboratory values:   Recent Labs  11/17/15 1500 11/18/15 0507 11/19/15 0736  HGB 12.6* 12.8* 12.7*  HCT 37.3* 38.3* 37.9*    Recent Labs  11/18/15 0507 11/19/15 0736  CREATININE 1.22 1.28*    Disposition: Home  Discharge instruction: The patient was instructed to be ambulatory but told to refrain from heavy lifting, strenuous activity, or driving.   Discharge medications:    Medication List    TAKE these medications        amLODipine 5 MG tablet  Commonly known as:  NORVASC  Take 5 mg by mouth daily.     ciprofloxacin 500 MG tablet  Commonly known as:  CIPRO  Take 1 tablet (500 mg total) by mouth 2 (two) times daily.     ciprofloxacin 500 MG tablet  Commonly known as:  CIPRO  Take 1 tablet (500 mg total) by mouth 2 (two) times daily.     ibuprofen 200 MG tablet  Commonly known as:  ADVIL,MOTRIN  Take 400 mg by mouth every 6 (six) hours as needed for fever, headache, mild pain, moderate pain or  cramping.     multivitamin with minerals Tabs tablet  Take 1 tablet by mouth daily.     oxybutynin 5 MG tablet  Commonly known as:  DITROPAN  Take 1 tablet (5 mg total) by mouth every 8 (eight) hours as needed for bladder spasms (burning with urination, bladder discomfort).     oxyCODONE-acetaminophen 5-325 MG tablet  Commonly known as:  PERCOCET/ROXICET  Take 1-2 tablets by mouth every 4 (four) hours as needed for severe pain.        Followup:

## 2015-11-17 NOTE — Op Note (Signed)
Preoperative diagnosis: bilateral ureteral calculi  Postoperative diagnosis: Same  Procedure: 1 cystoscopy 2. bilateralretrograde pyelography 3.  Intraoperative fluoroscopy, under one hour, with interpretation 4.  bilateral 6 x 26 JJ stent exchange  Attending: Cleda MccreedyPatrick Mackenzie  Anesthesia: General  Estimated blood loss: None  Drains: bilateral 6 x 26 JJ ureteral stent without tether  Specimens: none  Antibiotics: rocephin  Findings: bilateral ureteral stones. Moderate bilateral hydronephrosis. No masses/lesions in the bladder. Ureteral orifices in normal anatomic location.  Indications: Patient is a 50 year old male with a history of bilateral ureteral stone and concern for sepsis. After discussing treatment options, they decided proceed with bilateral stent placement.  Procedure her in detail: The patient was brought to the operating room and a brief timeout was done to ensure correct patient, correct procedure, correct site.  General anesthesia was administered patient was placed in dorsal lithotomy position.  Her genitalia was then prepped and draped in usual sterile fashion.  A rigid 22 French cystoscope was passed in the urethra and the bladder.  Bladder was inspected free masses or lesions.  the ureteral orifices were in the normal orthotopic locations. a 6 french ureteral catheter was then instilled into the left ureteral orifice.  a gentle retrograde was obtained and findings noted above. We then advanced a zipwire up to the renal pelvis. We then placed a 6 x 26 double-j ureteral stent over the zip wire. We then removed the wire and good coil was noted in the the renal pelvis under fluoroscopy and the bladder under direct vision.  We then turned out attention to the right side.  a gentle retrograde was obtained and findings noted above. We then advanced a zipwire up to the renal pelvis. we then placed a 6 x 26 double-j ureteral stent over the original zip wire.  We then removed the  wire and good coil was noted in the the renal pelvis under fluoroscopy and the bladder under direct vision.  the bladder was then drained and this concluded the procedure which was well tolerated by patient.  Complications: None  Condition: Stable, extubated, transferred to PACU  Plan: Patient is to be admitted for IV antibiotics. His foley was will be removed when patient is afebrile. He will be scheduled for bilateral stone extraction in 2 weeks after he has completed a course of culture appropriate antibiotics

## 2015-11-17 NOTE — H&P (Signed)
Urology Admission H&P  Chief Complaint: bilateral flank pain  History of Present Illness: Mr Derek Bennett is a 50yo who developed bilateral flank pain 2 days ago. He presented to the ER yesterday and was diagnosed with bilateral ureteral calculi, a UTI and right epididymitis. He was afebrile at the time. He then presented to my office today with complains of worsening pain, fevers to 101, and gross hematuria  Past Medical History  Diagnosis Date  . Hypertension   . Sarcoidosis [facility])    Past Surgical History  Procedure Laterality Date  . No past surgeries      Home Medications:  Prescriptions prior to admission  Medication Sig Dispense Refill Last Dose  . acetaminophen (TYLENOL) 500 MG tablet Take 1,000 mg by mouth every 6 (six) hours as needed for mild pain, moderate pain or headache.   Past Month at Unknown time  . amLODipine (NORVASC) 5 MG tablet Take 5 mg by mouth daily.   11/16/2015 at 0430  . Aspirin-Salicylamide-Caffeine (BC HEADACHE POWDER PO) Take 1 each by mouth every 8 (eight) hours as needed (pain).   Past Month at Unknown time  . ciprofloxacin (CIPRO) 500 MG tablet Take 1 tablet (500 mg total) by mouth 2 (two) times daily. 20 tablet 0 11/16/2015 at 2030  . Ibuprofen 200 MG CAPS Take 400 mg by mouth every 4 (four) hours as needed (pain.).   11/16/2015 at 1000  . oxyCODONE-acetaminophen (PERCOCET/ROXICET) 5-325 MG tablet Take 2 tablets by mouth every 4 (four) hours as needed for severe pain. 15 tablet 0    Allergies:  Allergies  Allergen Reactions  . Eggs Or Egg-Derived Products Shortness Of Breath    History reviewed. No pertinent family history. Social History:  reports that he quit smoking about 26 years ago. His smoking use included Cigars. He has never used smokeless tobacco. He reports that he drinks alcohol. He reports that he does not use illicit drugs.  Review of Systems  Constitutional: Positive for fever, chills, malaise/fatigue and diaphoresis.  Gastrointestinal:  Positive for nausea and vomiting.  Genitourinary: Positive for dysuria, urgency, frequency, hematuria and flank pain.  All other systems reviewed and are negative.   Physical Exam:  Vital signs in last 24 hours: Temp:  [98.4 F (36.9 C)-99 F (37.2 C)] 99 F (37.2 C) (05/05 1208) Pulse Rate:  [74-84] 84 (05/05 1208) Resp:  [16-18] 16 (05/05 1208) BP: (130-144)/(77-82) 130/77 mmHg (05/05 1208) SpO2:  [98 %-100 %] 99 % (05/05 1208) Weight:  [122.471 kg (270 lb)] 122.471 kg (270 lb) (05/05 1208) Physical Exam  Constitutional: He appears well-developed and well-nourished.  HENT:  Head: Normocephalic and atraumatic.  Eyes: EOM are normal. Pupils are equal, round, and reactive to light.  Neck: Normal range of motion. No thyromegaly present.  Cardiovascular: Normal rate and regular rhythm.   Respiratory: Effort normal. No respiratory distress.  GI: Soft. He exhibits no distension. Hernia confirmed negative in the right inguinal area and confirmed negative in the left inguinal area.  Genitourinary: Penis normal. Right testis shows swelling and tenderness. Left testis shows no mass, no swelling and no tenderness. Left testis is descended. Cremasteric reflex is not absent on the left side. Circumcised.  Lymphadenopathy:       Right: No inguinal adenopathy present.       Left: No inguinal adenopathy present.    Laboratory Data:  Results for orders placed or performed during the hospital encounter of 11/16/15 (from the past 24 hour(s))  Comprehensive metabolic panel  Status: Abnormal   Collection Time: 11/16/15  4:26 PM  Result Value Ref Range   Sodium 137 135 - 145 mmol/L   Potassium 3.8 3.5 - 5.1 mmol/L   Chloride 103 101 - 111 mmol/L   CO2 25 22 - 32 mmol/L   Glucose, Bld 101 (H) 65 - 99 mg/dL   BUN 12 6 - 20 mg/dL   Creatinine, Ser 7.821.35 (H) 0.61 - 1.24 mg/dL   Calcium 9.1 8.9 - 95.610.3 mg/dL   Total Protein 8.9 (H) 6.5 - 8.1 g/dL   Albumin 4.1 3.5 - 5.0 g/dL   AST 22 15 - 41 U/L    ALT 31 17 - 63 U/L   Alkaline Phosphatase 71 38 - 126 U/L   Total Bilirubin 0.8 0.3 - 1.2 mg/dL   GFR calc non Af Amer 59 (L) >60 mL/min   GFR calc Af Amer >60 >60 mL/min   Anion gap 9 5 - 15  CBC with Differential     Status: Abnormal   Collection Time: 11/16/15  4:26 PM  Result Value Ref Range   WBC 18.5 (H) 4.0 - 10.5 K/uL   RBC 4.60 4.22 - 5.81 MIL/uL   Hemoglobin 13.9 13.0 - 17.0 g/dL   HCT 21.340.7 08.639.0 - 57.852.0 %   MCV 88.5 78.0 - 100.0 fL   MCH 30.2 26.0 - 34.0 pg   MCHC 34.2 30.0 - 36.0 g/dL   RDW 46.913.3 62.911.5 - 52.815.5 %   Platelets 250 150 - 400 K/uL   Neutrophils Relative % 73 %   Neutro Abs 13.5 (H) 1.7 - 7.7 K/uL   Lymphocytes Relative 19 %   Lymphs Abs 3.4 0.7 - 4.0 K/uL   Monocytes Relative 8 %   Monocytes Absolute 1.5 (H) 0.1 - 1.0 K/uL   Eosinophils Relative 0 %   Eosinophils Absolute 0.0 0.0 - 0.7 K/uL   Basophils Relative 0 %   Basophils Absolute 0.0 0.0 - 0.1 K/uL  Lipase, blood     Status: None   Collection Time: 11/16/15  4:26 PM  Result Value Ref Range   Lipase 18 11 - 51 U/L  Urinalysis, Routine w reflex microscopic     Status: Abnormal   Collection Time: 11/16/15  6:18 PM  Result Value Ref Range   Color, Urine YELLOW YELLOW   APPearance TURBID (A) CLEAR   Specific Gravity, Urine 1.019 1.005 - 1.030   pH 6.5 5.0 - 8.0   Glucose, UA NEGATIVE NEGATIVE mg/dL   Hgb urine dipstick LARGE (A) NEGATIVE   Bilirubin Urine NEGATIVE NEGATIVE   Ketones, ur NEGATIVE NEGATIVE mg/dL   Protein, ur 30 (A) NEGATIVE mg/dL   Nitrite POSITIVE (A) NEGATIVE   Leukocytes, UA LARGE (A) NEGATIVE  Urine microscopic-add on     Status: Abnormal   Collection Time: 11/16/15  6:18 PM  Result Value Ref Range   Squamous Epithelial / LPF 0-5 (A) NONE SEEN   WBC, UA TOO NUMEROUS TO COUNT 0 - 5 WBC/hpf   RBC / HPF TOO NUMEROUS TO COUNT 0 - 5 RBC/hpf   Bacteria, UA MANY (A) NONE SEEN   No results found for this or any previous visit (from the past 240 hour(s)). Creatinine:  Recent  Labs  11/16/15 1626  CREATININE 1.35*   Baseline Creatinine: unknown  Impression/Assessment:  50yo with bilateral ureteral calculi, right epididymo-orchitis, sepsis from a urinary source  Plan:  The risks/benefits/alternatives to bilateral ureteral stent placement was explained to the patient and he  understands and wishes to proceed with surgery  Patrick McKenzie 11/17/2015, 12:24 PM       

## 2015-11-17 NOTE — Transfer of Care (Signed)
Immediate Anesthesia Transfer of Care Note  Patient: Derek Bennett  Procedure(s) Performed: Procedure(s) (LRB): BILATERAL URETERAL STENT PLACEMENT, BIL RETROGRADE, CYSTOSCOPY (Bilateral)  Patient Location: PACU  Anesthesia Type: General  Level of Consciousness: awake, sedated, patient cooperative and responds to stimulation  Airway & Oxygen Therapy: Patient Spontanous Breathing and Patient connected to face mask oxygen  Post-op Assessment: Report given to PACU RN, Post -op Vital signs reviewed and stable and Patient moving all extremities  Post vital signs: Reviewed and stable  Complications: No apparent anesthesia complications

## 2015-11-17 NOTE — Brief Op Note (Signed)
11/17/2015  2:27 PM  PATIENT:  Derek Bennett  50 y.o. male  PRE-OPERATIVE DIAGNOSIS:  BILATERAL URETERAL CALCULI  POST-OPERATIVE DIAGNOSIS:  BILATERAL URETERAL CALCULI  PROCEDURE:  Procedure(s): BILATERAL URETERAL STENT PLACEMENT, BIL RETROGRADE (Bilateral)  SURGEON:  Surgeon(s) and Role:    * Malen GauzePatrick L Carrianne Hyun, MD - Primary  PHYSICIAN ASSISTANT:   ASSISTANTS: none   ANESTHESIA:   general  EBL:  Total I/O In: 200 [I.V.:200] Out: -   BLOOD ADMINISTERED:none  DRAINS: Urinary Catheter (Foley)   LOCAL MEDICATIONS USED:  NONE  SPECIMEN:  No Specimen  DISPOSITION OF SPECIMEN:  N/A  COUNTS:  YES  TOURNIQUET:  * No tourniquets in log *  DICTATION: .Note written in EPIC  PLAN OF CARE: Admit for overnight observation  PATIENT DISPOSITION:  PACU - hemodynamically stable.   Delay start of Pharmacological VTE agent (>24hrs) due to surgical blood loss or risk of bleeding: not applicable

## 2015-11-17 NOTE — Anesthesia Preprocedure Evaluation (Addendum)
Anesthesia Evaluation  Patient identified by MRN, date of birth, ID band Patient awake    Reviewed: Allergy & Precautions, NPO status , Patient's Chart, lab work & pertinent test results  Airway Mallampati: II  TM Distance: >3 FB Neck ROM: Full    Dental  (+) Teeth Intact   Pulmonary former smoker,    breath sounds clear to auscultation       Cardiovascular hypertension, Pt. on medications  Rhythm:Regular Rate:Normal     Neuro/Psych negative neurological ROS  negative psych ROS   GI/Hepatic negative GI ROS, Neg liver ROS,   Endo/Other  negative endocrine ROS  Renal/GU negative Renal ROS  negative genitourinary   Musculoskeletal negative musculoskeletal ROS (+)   Abdominal   Peds negative pediatric ROS (+)  Hematology negative hematology ROS (+)   Anesthesia Other Findings   Reproductive/Obstetrics negative OB ROS                           Anesthesia Physical Anesthesia Plan  ASA: III  Anesthesia Plan: General   Post-op Pain Management:    Induction: Intravenous  Airway Management Planned: LMA  Additional Equipment:   Intra-op Plan:   Post-operative Plan: Extubation in OR  Informed Consent: I have reviewed the patients History and Physical, chart, labs and discussed the procedure including the risks, benefits and alternatives for the proposed anesthesia with the patient or authorized representative who has indicated his/her understanding and acceptance.   Dental advisory given  Plan Discussed with: CRNA  Anesthesia Plan Comments:        Anesthesia Quick Evaluation

## 2015-11-18 DIAGNOSIS — N201 Calculus of ureter: Secondary | ICD-10-CM | POA: Diagnosis not present

## 2015-11-18 LAB — BASIC METABOLIC PANEL
Anion gap: 8 (ref 5–15)
BUN: 18 mg/dL (ref 6–20)
CHLORIDE: 107 mmol/L (ref 101–111)
CO2: 21 mmol/L — AB (ref 22–32)
CREATININE: 1.22 mg/dL (ref 0.61–1.24)
Calcium: 8.9 mg/dL (ref 8.9–10.3)
GFR calc non Af Amer: >60 mL/min (ref >60)
Glucose, Bld: 124 mg/dL — ABNORMAL HIGH (ref 65–99)
Potassium: 4.1 mmol/L (ref 3.5–5.1)
Sodium: 136 mmol/L (ref 135–145)

## 2015-11-18 LAB — CBC
HEMATOCRIT: 38.3 % — AB (ref 39.0–52.0)
Hemoglobin: 12.8 g/dL — ABNORMAL LOW (ref 13.0–17.0)
MCH: 29.5 pg (ref 26.0–34.0)
MCHC: 33.4 g/dL (ref 30.0–36.0)
MCV: 88.2 fL (ref 78.0–100.0)
PLATELETS: 258 10*3/uL (ref 150–400)
RBC: 4.34 MIL/uL (ref 4.22–5.81)
RDW: 13.1 % (ref 11.5–15.5)
WBC: 18.1 10*3/uL — ABNORMAL HIGH (ref 4.0–10.5)

## 2015-11-18 MED ORDER — CIPROFLOXACIN HCL 500 MG PO TABS
500.0000 mg | ORAL_TABLET | Freq: Two times a day (BID) | ORAL | Status: DC
  Administered 2015-11-18 – 2015-11-19 (×3): 500 mg via ORAL
  Filled 2015-11-18 (×3): qty 1

## 2015-11-18 NOTE — Progress Notes (Signed)
Patient c/o burning with urination. Paged Urology x2 through answering service with no call back.  Patient states burning has gotten better since he received PO antibiotic.  Will continue to monitor.

## 2015-11-19 DIAGNOSIS — N201 Calculus of ureter: Secondary | ICD-10-CM | POA: Diagnosis not present

## 2015-11-19 LAB — BASIC METABOLIC PANEL
Anion gap: 7 (ref 5–15)
BUN: 18 mg/dL (ref 6–20)
CHLORIDE: 108 mmol/L (ref 101–111)
CO2: 26 mmol/L (ref 22–32)
Calcium: 8.6 mg/dL — ABNORMAL LOW (ref 8.9–10.3)
Creatinine, Ser: 1.28 mg/dL — ABNORMAL HIGH (ref 0.61–1.24)
GFR calc Af Amer: >60 mL/min (ref >60)
GFR calc non Af Amer: >60 mL/min (ref >60)
Glucose, Bld: 107 mg/dL — ABNORMAL HIGH (ref 65–99)
POTASSIUM: 3.9 mmol/L (ref 3.5–5.1)
SODIUM: 141 mmol/L (ref 135–145)

## 2015-11-19 LAB — CBC
HEMATOCRIT: 37.9 % — AB (ref 39.0–52.0)
HEMOGLOBIN: 12.7 g/dL — AB (ref 13.0–17.0)
MCH: 29.5 pg (ref 26.0–34.0)
MCHC: 33.5 g/dL (ref 30.0–36.0)
MCV: 88.1 fL (ref 78.0–100.0)
Platelets: 260 10*3/uL (ref 150–400)
RBC: 4.3 MIL/uL (ref 4.22–5.81)
RDW: 13.4 % (ref 11.5–15.5)
WBC: 14.1 10*3/uL — ABNORMAL HIGH (ref 4.0–10.5)

## 2015-11-19 MED ORDER — CIPROFLOXACIN HCL 500 MG PO TABS: 500.0000 mg | ORAL_TABLET | Freq: Two times a day (BID) | ORAL | Status: DC

## 2015-11-19 MED ORDER — OXYBUTYNIN CHLORIDE 5 MG PO TABS: 5.0000 mg | ORAL_TABLET | Freq: Three times a day (TID) | ORAL | Status: AC | PRN

## 2015-11-19 MED ORDER — OXYCODONE-ACETAMINOPHEN 5-325 MG PO TABS: 1.0000 | ORAL_TABLET | ORAL | Status: DC | PRN

## 2015-11-20 ENCOUNTER — Encounter (HOSPITAL_BASED_OUTPATIENT_CLINIC_OR_DEPARTMENT_OTHER): Payer: Self-pay | Admitting: Urology

## 2015-11-21 ENCOUNTER — Other Ambulatory Visit: Payer: Self-pay | Admitting: Urology

## 2015-11-23 NOTE — Anesthesia Postprocedure Evaluation (Signed)
Anesthesia Post Note  Patient: Derek Bennett  Procedure(s) Performed: Procedure(s) (LRB): BILATERAL URETERAL STENT PLACEMENT, BIL RETROGRADE, CYSTOSCOPY (Bilateral)  Patient location during evaluation: PACU Anesthesia Type: General Level of consciousness: awake Pain management: pain level controlled Vital Signs Assessment: post-procedure vital signs reviewed and stable Respiratory status: spontaneous breathing Cardiovascular status: stable Postop Assessment: no signs of nausea or vomiting Anesthetic complications: no    Last Vitals:  Filed Vitals:   11/18/15 2155 11/19/15 0550  BP: 125/74 140/88  Pulse: 67 55  Temp: 36.7 C 36.7 C  Resp: 18 18    Last Pain:  Filed Vitals:   11/19/15 1106  PainSc: 3                  Milania Haubner

## 2015-12-08 ENCOUNTER — Encounter (HOSPITAL_BASED_OUTPATIENT_CLINIC_OR_DEPARTMENT_OTHER): Payer: Self-pay | Admitting: *Deleted

## 2015-12-08 NOTE — Progress Notes (Signed)
NPO AFTER MN WITH EXCEPTION CLEAR LIQUIDS UNTIL 0700 ( NO CREAM /MILK PRODUCTS).  ARRIVE AT 1145.  CURRENT LAB RESULTS, CXR , AND EKG IN CHART AND EPIC.  WILL TAKE NORVASC AM DOS W/ SIPS OF WATER AND IF NEEDED TAKE OXYCODONE/ DITROPAN .

## 2015-12-15 ENCOUNTER — Encounter (HOSPITAL_BASED_OUTPATIENT_CLINIC_OR_DEPARTMENT_OTHER)
Admission: RE | Disposition: A | Discharge status: Home / Self Care | Payer: Self-pay | Source: Ambulatory Visit | Attending: Urology

## 2015-12-15 ENCOUNTER — Ambulatory Visit (HOSPITAL_BASED_OUTPATIENT_CLINIC_OR_DEPARTMENT_OTHER)
Admission: RE | Admit: 2015-12-15 | Discharge: 2015-12-15 | Disposition: A | Discharge status: Home / Self Care | Payer: Managed Care, Other (non HMO) | Source: Ambulatory Visit | Attending: Urology | Admitting: Urology

## 2015-12-15 ENCOUNTER — Encounter (HOSPITAL_BASED_OUTPATIENT_CLINIC_OR_DEPARTMENT_OTHER): Payer: Self-pay | Admitting: *Deleted

## 2015-12-15 ENCOUNTER — Ambulatory Visit (HOSPITAL_BASED_OUTPATIENT_CLINIC_OR_DEPARTMENT_OTHER): Payer: Managed Care, Other (non HMO) | Admitting: Certified Registered"

## 2015-12-15 DIAGNOSIS — Z87891 Personal history of nicotine dependence: Secondary | ICD-10-CM | POA: Insufficient documentation

## 2015-12-15 DIAGNOSIS — Z79899 Other long term (current) drug therapy: Secondary | ICD-10-CM | POA: Insufficient documentation

## 2015-12-15 DIAGNOSIS — N201 Calculus of ureter: Secondary | ICD-10-CM | POA: Diagnosis present

## 2015-12-15 DIAGNOSIS — G4730 Sleep apnea, unspecified: Secondary | ICD-10-CM | POA: Diagnosis not present

## 2015-12-15 DIAGNOSIS — I10 Essential (primary) hypertension: Secondary | ICD-10-CM | POA: Diagnosis not present

## 2015-12-15 DIAGNOSIS — Z7982 Long term (current) use of aspirin: Secondary | ICD-10-CM | POA: Insufficient documentation

## 2015-12-15 DIAGNOSIS — Z91012 Allergy to eggs: Secondary | ICD-10-CM | POA: Diagnosis not present

## 2015-12-15 DIAGNOSIS — N132 Hydronephrosis with renal and ureteral calculous obstruction: Secondary | ICD-10-CM | POA: Diagnosis not present

## 2015-12-15 DIAGNOSIS — Z6837 Body mass index (BMI) 37.0-37.9, adult: Secondary | ICD-10-CM | POA: Diagnosis not present

## 2015-12-15 DIAGNOSIS — E669 Obesity, unspecified: Secondary | ICD-10-CM | POA: Insufficient documentation

## 2015-12-15 HISTORY — DX: Hematuria, unspecified: R31.9

## 2015-12-15 HISTORY — DX: Calculus of kidney: N20.0

## 2015-12-15 HISTORY — PX: CYSTOSCOPY/RETROGRADE/URETEROSCOPY/STONE EXTRACTION WITH BASKET: SHX5317

## 2015-12-15 HISTORY — DX: Calculus of ureter: N20.1

## 2015-12-15 HISTORY — DX: Sarcoidosis of lung: D86.0

## 2015-12-15 HISTORY — DX: Obstructive sleep apnea (adult) (pediatric): G47.33

## 2015-12-15 HISTORY — PX: CYSTOSCOPY W/ URETERAL STENT PLACEMENT: SHX1429

## 2015-12-15 HISTORY — DX: Other specified disorders of bladder: N32.89

## 2015-12-15 HISTORY — PX: CYSTOSCOPY WITH HOLMIUM LASER LITHOTRIPSY: SHX6639

## 2015-12-15 SURGERY — CYSTOSCOPY, WITH CALCULUS REMOVAL USING BASKET
Anesthesia: General | Site: Ureter | Laterality: Bilateral

## 2015-12-15 MED ORDER — OXYCODONE-ACETAMINOPHEN 5-325 MG PO TABS: 1.0000 | ORAL_TABLET | ORAL | Status: AC | PRN

## 2015-12-15 MED ORDER — MEPERIDINE HCL 25 MG/ML IJ SOLN
6.2500 mg | INTRAMUSCULAR | Status: DC | PRN
  Filled 2015-12-15: qty 1

## 2015-12-15 MED ORDER — CEFTRIAXONE SODIUM 2 G IJ SOLR
INTRAMUSCULAR | Status: AC
  Filled 2015-12-15: qty 2

## 2015-12-15 MED ORDER — FENTANYL CITRATE (PF) 100 MCG/2ML IJ SOLN
INTRAMUSCULAR | Status: DC | PRN
  Administered 2015-12-15: 25 ug via INTRAVENOUS
  Administered 2015-12-15: 50 ug via INTRAVENOUS
  Administered 2015-12-15: 25 ug via INTRAVENOUS

## 2015-12-15 MED ORDER — SODIUM CHLORIDE 0.9 % IR SOLN
Status: DC | PRN
  Administered 2015-12-15: 4000 mL
  Administered 2015-12-15: 500 mL

## 2015-12-15 MED ORDER — DEXTROSE 5 % IV SOLN
INTRAVENOUS | Status: AC
  Filled 2015-12-15: qty 50

## 2015-12-15 MED ORDER — ONDANSETRON HCL 4 MG/2ML IJ SOLN
INTRAMUSCULAR | Status: AC
  Filled 2015-12-15: qty 2

## 2015-12-15 MED ORDER — SULFAMETHOXAZOLE-TRIMETHOPRIM 800-160 MG PO TABS: 1.0000 | ORAL_TABLET | Freq: Two times a day (BID) | ORAL | Status: AC

## 2015-12-15 MED ORDER — ONDANSETRON HCL 4 MG/2ML IJ SOLN
4.0000 mg | Freq: Once | INTRAMUSCULAR | Status: DC | PRN
  Filled 2015-12-15: qty 2

## 2015-12-15 MED ORDER — LACTATED RINGERS IV SOLN
INTRAVENOUS | Status: DC
  Administered 2015-12-15: 13:00:00 via INTRAVENOUS
  Filled 2015-12-15: qty 1000

## 2015-12-15 MED ORDER — KETOROLAC TROMETHAMINE 30 MG/ML IJ SOLN
INTRAMUSCULAR | Status: DC | PRN
  Administered 2015-12-15: 30 mg via INTRAVENOUS

## 2015-12-15 MED ORDER — DEXAMETHASONE SODIUM PHOSPHATE 4 MG/ML IJ SOLN
INTRAMUSCULAR | Status: DC | PRN
Start: 2015-12-15 — End: 2015-12-15
  Administered 2015-12-15: 10 mg via INTRAVENOUS

## 2015-12-15 MED ORDER — PROPOFOL 10 MG/ML IV BOLUS
INTRAVENOUS | Status: AC
  Filled 2015-12-15: qty 20

## 2015-12-15 MED ORDER — MIDAZOLAM HCL 2 MG/2ML IJ SOLN
INTRAMUSCULAR | Status: AC
  Filled 2015-12-15: qty 2

## 2015-12-15 MED ORDER — DEXAMETHASONE SODIUM PHOSPHATE 10 MG/ML IJ SOLN
INTRAMUSCULAR | Status: AC
Start: 2015-12-15 — End: 2015-12-15
  Filled 2015-12-15: qty 1

## 2015-12-15 MED ORDER — PROPOFOL 10 MG/ML IV BOLUS
INTRAVENOUS | Status: DC | PRN
  Administered 2015-12-15: 200 mg via INTRAVENOUS

## 2015-12-15 MED ORDER — LIDOCAINE 2% (20 MG/ML) 5 ML SYRINGE
INTRAMUSCULAR | Status: DC | PRN
  Administered 2015-12-15: 80 mg via INTRAVENOUS

## 2015-12-15 MED ORDER — LIDOCAINE HCL (CARDIAC) 20 MG/ML IV SOLN
INTRAVENOUS | Status: AC
  Filled 2015-12-15: qty 5

## 2015-12-15 MED ORDER — ONDANSETRON HCL 4 MG/2ML IJ SOLN
INTRAMUSCULAR | Status: DC | PRN
  Administered 2015-12-15: 4 mg via INTRAVENOUS

## 2015-12-15 MED ORDER — KETOROLAC TROMETHAMINE 30 MG/ML IJ SOLN
INTRAMUSCULAR | Status: AC
  Filled 2015-12-15: qty 1

## 2015-12-15 MED ORDER — FENTANYL CITRATE (PF) 100 MCG/2ML IJ SOLN
INTRAMUSCULAR | Status: AC
  Filled 2015-12-15: qty 2

## 2015-12-15 MED ORDER — MIDAZOLAM HCL 5 MG/5ML IJ SOLN
INTRAMUSCULAR | Status: DC | PRN
  Administered 2015-12-15: 2 mg via INTRAVENOUS

## 2015-12-15 MED ORDER — HYDROCODONE-ACETAMINOPHEN 7.5-325 MG PO TABS
1.0000 | ORAL_TABLET | Freq: Once | ORAL | Status: DC | PRN
  Filled 2015-12-15: qty 1

## 2015-12-15 MED ORDER — DIATRIZOATE MEGLUMINE 30 % UR SOLN
URETHRAL | Status: DC | PRN
  Administered 2015-12-15: 20 mL

## 2015-12-15 MED ORDER — DEXTROSE 5 % IV SOLN
2.0000 g | INTRAVENOUS | Status: AC
  Administered 2015-12-15: 2 g via INTRAVENOUS
  Filled 2015-12-15: qty 2

## 2015-12-15 MED ORDER — HYDROMORPHONE HCL 1 MG/ML IJ SOLN
0.2500 mg | INTRAMUSCULAR | Status: DC | PRN
  Filled 2015-12-15: qty 1

## 2015-12-15 SURGICAL SUPPLY — 29 items
BAG DRAIN URO-CYSTO SKYTR STRL (DRAIN) ×2 IMPLANT
BAG DRN UROCATH (DRAIN) ×1
BASKET DAKOTA 1.9FR 11X120 (BASKET) IMPLANT
BASKET LASER NITINOL 1.9FR (BASKET) IMPLANT
BASKET STONE 1.7 NGAGE (UROLOGICAL SUPPLIES) ×1 IMPLANT
BASKET ZERO TIP NITINOL 2.4FR (BASKET) IMPLANT
BSKT STON RTRVL 120 1.9FR (BASKET)
BSKT STON RTRVL ZERO TP 2.4FR (BASKET)
CATH INTERMIT  6FR 70CM (CATHETERS) IMPLANT
CLOTH BEACON ORANGE TIMEOUT ST (SAFETY) ×2 IMPLANT
FIBER LASER FLEXIVA 365 (UROLOGICAL SUPPLIES) IMPLANT
FIBER LASER TRAC TIP (UROLOGICAL SUPPLIES) ×1 IMPLANT
GLOVE BIO SURGEON STRL SZ8 (GLOVE) ×2 IMPLANT
GOWN STRL REUS W/ TWL LRG LVL3 (GOWN DISPOSABLE) ×1 IMPLANT
GOWN STRL REUS W/ TWL XL LVL3 (GOWN DISPOSABLE) ×1 IMPLANT
GOWN STRL REUS W/TWL LRG LVL3 (GOWN DISPOSABLE) ×2
GOWN STRL REUS W/TWL XL LVL3 (GOWN DISPOSABLE) ×2
GUIDEWIRE ANG ZIPWIRE 038X150 (WIRE) ×2 IMPLANT
GUIDEWIRE STR DUAL SENSOR (WIRE) ×1 IMPLANT
IV NS 1000ML (IV SOLUTION) ×2
IV NS 1000ML BAXH (IV SOLUTION) IMPLANT
IV NS IRRIG 3000ML ARTHROMATIC (IV SOLUTION) ×2 IMPLANT
KIT ROOM TURNOVER WOR (KITS) ×2 IMPLANT
MANIFOLD NEPTUNE II (INSTRUMENTS) ×1 IMPLANT
PACK CYSTO (CUSTOM PROCEDURE TRAY) ×2 IMPLANT
STENT URET 6FRX26 CONTOUR (STENTS) ×2 IMPLANT
SYRINGE 10CC LL (SYRINGE) ×2 IMPLANT
TUBE CONNECTING 12X1/4 (SUCTIONS) ×1 IMPLANT
TUBE FEEDING 8FR 16IN STR KANG (MISCELLANEOUS) IMPLANT

## 2015-12-15 NOTE — Anesthesia Postprocedure Evaluation (Signed)
Anesthesia Post Note  Patient: Derek Bennett  Procedure(s) Performed: Procedure(s) (LRB): CYSTOSCOPY/RETROGRADE/URETEROSCOPY/STONE EXTRACTION WITH BASKET (Bilateral) CYSTOSCOPY WITH HOLMIUM LASER LITHOTRIPSY (Bilateral) CYSTOSCOPY WITH STENT REPLACEMENT (Bilateral)  Patient location during evaluation: PACU Anesthesia Type: General Level of consciousness: awake and alert Pain management: pain level controlled Vital Signs Assessment: post-procedure vital signs reviewed and stable Respiratory status: spontaneous breathing, nonlabored ventilation, respiratory function stable and patient connected to nasal cannula oxygen Cardiovascular status: blood pressure returned to baseline and stable Postop Assessment: no signs of nausea or vomiting Anesthetic complications: no    Last Vitals:  Filed Vitals:   12/15/15 1530 12/15/15 1545  BP: 119/84 139/88  Pulse: 50 60  Temp:    Resp: 11 16    Last Pain:  Filed Vitals:   12/15/15 1549  PainSc: 3                  Shelton SilvasKevin D Mccrae Speciale

## 2015-12-15 NOTE — Anesthesia Preprocedure Evaluation (Addendum)
Anesthesia Evaluation  Patient identified by MRN, date of birth, ID band Patient awake    Reviewed: Allergy & Precautions, NPO status , Patient's Chart, lab work & pertinent test results  Airway Mallampati: II  TM Distance: >3 FB Neck ROM: Full    Dental  (+) Caps,    Pulmonary sleep apnea and Continuous Positive Airway Pressure Ventilation , former smoker,  Has not used CPAP since 07/2015 Pulmonary sarcoidosis- not on any Rx   Pulmonary exam normal breath sounds clear to auscultation       Cardiovascular hypertension, Pt. on medications Normal cardiovascular exam Rhythm:Regular Rate:Normal     Neuro/Psych negative neurological ROS  negative psych ROS   GI/Hepatic negative GI ROS, Neg liver ROS,   Endo/Other  negative endocrine ROSObesity  Renal/GU Renal diseasenegative Renal ROSBilateral ureteral calculi Indwelling bilateral ureteral stents  negative genitourinary   Musculoskeletal negative musculoskeletal ROS (+)   Abdominal (+) + obese,   Peds negative pediatric ROS (+)  Hematology negative hematology ROS (+)   Anesthesia Other Findings   Reproductive/Obstetrics negative OB ROS                            Anesthesia Physical  Anesthesia Plan  ASA: III  Anesthesia Plan: General   Post-op Pain Management:    Induction: Intravenous  Airway Management Planned: LMA  Additional Equipment:   Intra-op Plan:   Post-operative Plan: Extubation in OR  Informed Consent: I have reviewed the patients History and Physical, chart, labs and discussed the procedure including the risks, benefits and alternatives for the proposed anesthesia with the patient or authorized representative who has indicated his/her understanding and acceptance.   Dental advisory given  Plan Discussed with: CRNA, Anesthesiologist and Surgeon  Anesthesia Plan Comments:         Anesthesia Quick  Evaluation

## 2015-12-15 NOTE — Anesthesia Procedure Notes (Signed)
Procedure Name: LMA Insertion Date/Time: 12/15/2015 1:53 PM Performed by: Renella CunasHAZEL, Dino Borntreger D Pre-anesthesia Checklist: Patient identified, Emergency Drugs available, Suction available and Patient being monitored Patient Re-evaluated:Patient Re-evaluated prior to inductionOxygen Delivery Method: Circle system utilized Preoxygenation: Pre-oxygenation with 100% oxygen Intubation Type: IV induction Ventilation: Mask ventilation without difficulty LMA: LMA inserted LMA Size: 4.0 Number of attempts: 1 Airway Equipment and Method: Bite block Placement Confirmation: positive ETCO2 Tube secured with: Tape Dental Injury: Teeth and Oropharynx as per pre-operative assessment

## 2015-12-15 NOTE — Transfer of Care (Signed)
Immediate Anesthesia Transfer of Care Note  Patient: Derek Bennett  Procedure(s) Performed: Procedure(s) (LRB): CYSTOSCOPY/RETROGRADE/URETEROSCOPY/STONE EXTRACTION WITH BASKET (Bilateral) CYSTOSCOPY WITH HOLMIUM LASER LITHOTRIPSY (Bilateral) CYSTOSCOPY WITH STENT REPLACEMENT (Bilateral)  Patient Location: PACU  Anesthesia Type: General  Level of Consciousness: awake, oriented, sedated and patient cooperative  Airway & Oxygen Therapy: Patient Spontanous Breathing and Patient connected to face mask oxygen  Post-op Assessment: Report given to PACU RN and Post -op Vital signs reviewed and stable  Post vital signs: Reviewed and stable  Complications: No apparent anesthesia complications  Last Vitals:  Filed Vitals:   12/15/15 1145 12/15/15 1455  BP: 123/71 129/85  Pulse: 60 61  Temp: 36.7 C 36.5 C  Resp: 16 14

## 2015-12-15 NOTE — Brief Op Note (Signed)
12/15/2015  2:44 PM  PATIENT:  Derek Bennett  50 y.o. male  PRE-OPERATIVE DIAGNOSIS:  BILATERAL URETERAL CALCULI  POST-OPERATIVE DIAGNOSIS:  BILATERAL URETERAL CALCULI  PROCEDURE:  Procedure(s): CYSTOSCOPY/RETROGRADE/URETEROSCOPY/STONE EXTRACTION WITH BASKET (Bilateral) CYSTOSCOPY WITH HOLMIUM LASER LITHOTRIPSY (Bilateral) CYSTOSCOPY WITH STENT REPLACEMENT (Bilateral)  SURGEON:  Surgeon(s) and Role:    * Malen GauzePatrick L Tarena Gockley, MD - Primary  PHYSICIAN ASSISTANT:   ASSISTANTS: none   ANESTHESIA:   general  EBL:  Total I/O In: 200 [I.V.:200] Out: -   BLOOD ADMINISTERED:none  DRAINS: bilateral 6x26 JJ ureteral stents with tethers  LOCAL MEDICATIONS USED:  NONE  SPECIMEN:  Source of Specimen:  bilateral ureteral calculi  DISPOSITION OF SPECIMEN:  N/A  COUNTS:  YES  TOURNIQUET:  * No tourniquets in log *  DICTATION: .Note written in EPIC  PLAN OF CARE: Discharge to home after PACU  PATIENT DISPOSITION:  PACU - hemodynamically stable.   Delay start of Pharmacological VTE agent (>24hrs) due to surgical blood loss or risk of bleeding: not applicable

## 2015-12-15 NOTE — Interval H&P Note (Signed)
History and Physical Interval Note:  12/15/2015 1:29 PM  Derek Bennett  has presented today for surgery, with the diagnosis of BILATERAL URETERAL CALCULI  The various methods of treatment have been discussed with the patient and family. After consideration of risks, benefits and other options for treatment, the patient has consented to  Procedure(s): CYSTOSCOPY/RETROGRADE/URETEROSCOPY/STONE EXTRACTION WITH BASKET (Bilateral) CYSTOSCOPY WITH HOLMIUM LASER LITHOTRIPSY (Bilateral) CYSTOSCOPY WITH STENT REPLACEMENT (Bilateral) as a surgical intervention .  The patient's history has been reviewed, patient examined, no change in status, stable for surgery.  I have reviewed the patient's chart and labs.  Questions were answered to the patient's satisfaction.     Wilkie AyePatrick Darleen Moffitt

## 2015-12-15 NOTE — H&P (View-Only) (Signed)
Urology Admission H&P  Chief Complaint: bilateral flank pain  History of Present Illness: Mr Derek Bennett is a 50yo who developed bilateral flank pain 2 days ago. He presented to the ER yesterday and was diagnosed with bilateral ureteral calculi, a UTI and right epididymitis. He was afebrile at the time. He then presented to my office today with complains of worsening pain, fevers to 101, and gross hematuria  Past Medical History  Diagnosis Date  . Hypertension   . Sarcoidosis [facility](HCC)    Past Surgical History  Procedure Laterality Date  . No past surgeries      Home Medications:  Prescriptions prior to admission  Medication Sig Dispense Refill Last Dose  . acetaminophen (TYLENOL) 500 MG tablet Take 1,000 mg by mouth every 6 (six) hours as needed for mild pain, moderate pain or headache.   Past Month at Unknown time  . amLODipine (NORVASC) 5 MG tablet Take 5 mg by mouth daily.   11/16/2015 at 0430  . Aspirin-Salicylamide-Caffeine (BC HEADACHE POWDER PO) Take 1 each by mouth every 8 (eight) hours as needed (pain).   Past Month at Unknown time  . ciprofloxacin (CIPRO) 500 MG tablet Take 1 tablet (500 mg total) by mouth 2 (two) times daily. 20 tablet 0 11/16/2015 at 2030  . Ibuprofen 200 MG CAPS Take 400 mg by mouth every 4 (four) hours as needed (pain.).   11/16/2015 at 1000  . oxyCODONE-acetaminophen (PERCOCET/ROXICET) 5-325 MG tablet Take 2 tablets by mouth every 4 (four) hours as needed for severe pain. 15 tablet 0    Allergies:  Allergies  Allergen Reactions  . Eggs Or Egg-Derived Products Shortness Of Breath    History reviewed. No pertinent family history. Social History:  reports that he quit smoking about 26 years ago. His smoking use included Cigars. He has never used smokeless tobacco. He reports that he drinks alcohol. He reports that he does not use illicit drugs.  Review of Systems  Constitutional: Positive for fever, chills, malaise/fatigue and diaphoresis.  Gastrointestinal:  Positive for nausea and vomiting.  Genitourinary: Positive for dysuria, urgency, frequency, hematuria and flank pain.  All other systems reviewed and are negative.   Physical Exam:  Vital signs in last 24 hours: Temp:  [98.4 F (36.9 C)-99 F (37.2 C)] 99 F (37.2 C) (05/05 1208) Pulse Rate:  [74-84] 84 (05/05 1208) Resp:  [16-18] 16 (05/05 1208) BP: (130-144)/(77-82) 130/77 mmHg (05/05 1208) SpO2:  [98 %-100 %] 99 % (05/05 1208) Weight:  [122.471 kg (270 lb)] 122.471 kg (270 lb) (05/05 1208) Physical Exam  Constitutional: He appears well-developed and well-nourished.  HENT:  Head: Normocephalic and atraumatic.  Eyes: EOM are normal. Pupils are equal, round, and reactive to light.  Neck: Normal range of motion. No thyromegaly present.  Cardiovascular: Normal rate and regular rhythm.   Respiratory: Effort normal. No respiratory distress.  GI: Soft. He exhibits no distension. Hernia confirmed negative in the right inguinal area and confirmed negative in the left inguinal area.  Genitourinary: Penis normal. Right testis shows swelling and tenderness. Left testis shows no mass, no swelling and no tenderness. Left testis is descended. Cremasteric reflex is not absent on the left side. Circumcised.  Lymphadenopathy:       Right: No inguinal adenopathy present.       Left: No inguinal adenopathy present.    Laboratory Data:  Results for orders placed or performed during the hospital encounter of 11/16/15 (from the past 24 hour(s))  Comprehensive metabolic panel  Status: Abnormal   Collection Time: 11/16/15  4:26 PM  Result Value Ref Range   Sodium 137 135 - 145 mmol/L   Potassium 3.8 3.5 - 5.1 mmol/L   Chloride 103 101 - 111 mmol/L   CO2 25 22 - 32 mmol/L   Glucose, Bld 101 (H) 65 - 99 mg/dL   BUN 12 6 - 20 mg/dL   Creatinine, Ser 7.821.35 (H) 0.61 - 1.24 mg/dL   Calcium 9.1 8.9 - 95.610.3 mg/dL   Total Protein 8.9 (H) 6.5 - 8.1 g/dL   Albumin 4.1 3.5 - 5.0 g/dL   AST 22 15 - 41 U/L    ALT 31 17 - 63 U/L   Alkaline Phosphatase 71 38 - 126 U/L   Total Bilirubin 0.8 0.3 - 1.2 mg/dL   GFR calc non Af Amer 59 (L) >60 mL/min   GFR calc Af Amer >60 >60 mL/min   Anion gap 9 5 - 15  CBC with Differential     Status: Abnormal   Collection Time: 11/16/15  4:26 PM  Result Value Ref Range   WBC 18.5 (H) 4.0 - 10.5 K/uL   RBC 4.60 4.22 - 5.81 MIL/uL   Hemoglobin 13.9 13.0 - 17.0 g/dL   HCT 21.340.7 08.639.0 - 57.852.0 %   MCV 88.5 78.0 - 100.0 fL   MCH 30.2 26.0 - 34.0 pg   MCHC 34.2 30.0 - 36.0 g/dL   RDW 46.913.3 62.911.5 - 52.815.5 %   Platelets 250 150 - 400 K/uL   Neutrophils Relative % 73 %   Neutro Abs 13.5 (H) 1.7 - 7.7 K/uL   Lymphocytes Relative 19 %   Lymphs Abs 3.4 0.7 - 4.0 K/uL   Monocytes Relative 8 %   Monocytes Absolute 1.5 (H) 0.1 - 1.0 K/uL   Eosinophils Relative 0 %   Eosinophils Absolute 0.0 0.0 - 0.7 K/uL   Basophils Relative 0 %   Basophils Absolute 0.0 0.0 - 0.1 K/uL  Lipase, blood     Status: None   Collection Time: 11/16/15  4:26 PM  Result Value Ref Range   Lipase 18 11 - 51 U/L  Urinalysis, Routine w reflex microscopic     Status: Abnormal   Collection Time: 11/16/15  6:18 PM  Result Value Ref Range   Color, Urine YELLOW YELLOW   APPearance TURBID (A) CLEAR   Specific Gravity, Urine 1.019 1.005 - 1.030   pH 6.5 5.0 - 8.0   Glucose, UA NEGATIVE NEGATIVE mg/dL   Hgb urine dipstick LARGE (A) NEGATIVE   Bilirubin Urine NEGATIVE NEGATIVE   Ketones, ur NEGATIVE NEGATIVE mg/dL   Protein, ur 30 (A) NEGATIVE mg/dL   Nitrite POSITIVE (A) NEGATIVE   Leukocytes, UA LARGE (A) NEGATIVE  Urine microscopic-add on     Status: Abnormal   Collection Time: 11/16/15  6:18 PM  Result Value Ref Range   Squamous Epithelial / LPF 0-5 (A) NONE SEEN   WBC, UA TOO NUMEROUS TO COUNT 0 - 5 WBC/hpf   RBC / HPF TOO NUMEROUS TO COUNT 0 - 5 RBC/hpf   Bacteria, UA MANY (A) NONE SEEN   No results found for this or any previous visit (from the past 240 hour(s)). Creatinine:  Recent  Labs  11/16/15 1626  CREATININE 1.35*   Baseline Creatinine: unknown  Impression/Assessment:  50yo with bilateral ureteral calculi, right epididymo-orchitis, sepsis from a urinary source  Plan:  The risks/benefits/alternatives to bilateral ureteral stent placement was explained to the patient and he  understands and wishes to proceed with surgery  Wilkie Aye 11/17/2015, 12:24 PM

## 2015-12-15 NOTE — Discharge Instructions (Signed)
Ureteroscopy, Care After °Refer to this sheet in the next few weeks. These instructions provide you with information on caring for yourself after your procedure. Your health care provider may also give you more specific instructions. Your treatment has been planned according to current medical practices, but problems sometimes occur. Call your health care provider if you have any problems or questions after your procedure.  °WHAT TO EXPECT AFTER THE PROCEDURE  °After your procedure, it is typical to have the following:  °· A burning sensation when you urinate. °· Blood in your urine. °HOME CARE INSTRUCTIONS  °· Only take medicines as directed by your health care provider. Do not take any over-the-counter pain medication unless your health care provider says it is okay. °· Take a warm bath or hold a warm washcloth over your groin to relieve burning. °· Drink enough fluids to keep your urine clear or pale yellow. °¨ Drink two 8-ounce glasses of water every hour for the first 2 hours after you get home. °¨ Continue to drink water often at home. °· You can eat what you usually do. °· Ask your surgeon when you can do your usual activities. °· If you had a tube placed to keep urine flowing (ureteral stent), ask your health care provider when you need to return to have it removed. °· Keep all follow-up appointments. °SEEK MEDICAL CARE IF:  °· You have chills or fever. °· You have burning pain for longer than 24 hours after the procedure. °· You have blood in your urine for longer than 24 hours after the procedure. °SEEK IMMEDIATE MEDICAL CARE IF:  °· You have large amounts of blood or clots in your urine. °· You have very bad pain. °· You have chest pain or trouble breathing. °  °This information is not intended to replace advice given to you by your health care provider. Make sure you discuss any questions you have with your health care provider. °  °Document Released: 07/06/2013 Document Reviewed: 07/06/2013 °Elsevier  Interactive Patient Education ©2016 [employer] ° ° °Post Anesthesia Home Care Instructions ° °Activity: °Get plenty of rest for the remainder of the day. A responsible adult should stay with you for 24 hours following the procedure.  °For the next 24 hours, DO NOT: °-Drive a car °-Operate machinery °-Drink alcoholic beverages °-Take any medication unless instructed by your physician °-Make any legal decisions or sign important papers. ° °Meals: °Start with liquid foods such as gelatin or soup. Progress to regular foods as tolerated. Avoid greasy, spicy, heavy foods. If nausea and/or vomiting occur, drink only clear liquids until the nausea and/or vomiting subsides. Call your physician if vomiting continues. ° °Special Instructions/Symptoms: °Your throat may feel dry or sore from the anesthesia or the breathing tube placed in your throat during surgery. If this causes discomfort, gargle with warm salt water. The discomfort should disappear within 24 hours. ° °If you had a scopolamine patch placed behind your ear for the management of post- operative nausea and/or vomiting: ° °1. The medication in the patch is effective for 72 hours, after which it should be removed.  Wrap patch in a tissue and discard in the trash. Wash hands thoroughly with soap and water. °2. You may remove the patch earlier than 72 hours if you experience unpleasant side effects which may include dry mouth, dizziness or visual disturbances. °3. Avoid touching the patch. Wash your hands with soap and water after contact with the patch. °  ° °

## 2015-12-17 NOTE — Op Note (Signed)
.  Preoperative diagnosis: bilateral ureteral stone  Postoperative diagnosis: Same  Procedure: 1 cystoscopy 2. bilateralretrograde pyelography 3.  Intraoperative fluoroscopy, under one hour, with interpretation 4.  Bilateral ureteroscopic stone manipulation with laser lithotripsy 5.  bilateral 6 x 26 JJ stent exchange  Attending: Cleda MccreedyPatrick Mackenzie  Anesthesia: General  Estimated blood loss: None  Drains: bilateral 6 x 26 JJ ureteral stent with tether  Specimens: stone for analysis  Antibiotics: rocephin  Findings: bilateral proximal ureteral stones. Mild bilateral hydronephrosis. No masses/lesions in the bladder. Ureteral orifices in normal anatomic location.  Indications: Patient is a 50 year old male with a history of bilateral proximal ureteral stone and concern for sepsis. He is status post bilateral stent placement. After discussing treatment options, they decided proceed with bilateral ureteroscopic stone manipulation.  Procedure her in detail: The patient was brought to the operating room and a brief timeout was done to ensure correct patient, correct procedure, correct site.  General anesthesia was administered patient was placed in dorsal lithotomy position.  Her genitalia was then prepped and draped in usual sterile fashion.  A rigid 22 French cystoscope was passed in the urethra and the bladder.  Bladder was inspected free masses or lesions.  the ureteral orifices were in the normal orthotopic locations. Using a grasper the ureteral stent was brought to the urethral meatus. Through the stent a zipwire was advanced up to the renal pelvis. The stent was then removed. a 6 french ureteral catheter was then instilled into the left ureteral orifice.  a gentle retrograde was obtained and findings noted above.  we then removed the cystoscope and cannulated the left ureteral orifice with a semirigid ureteroscope.  We located the stone in the proximal ureter. Using a 200nm laser fiber the  stone was fragmented and the fragments were removed with an Ngage basket. We then placed a 6 x 26 double-j ureteral stent over the original zip wire. We then removed the wire and good coil was noted in the the renal pelvis under fluoroscopy and the bladder under direct vision.  We then turned out attention to the right side. Using a grasper the ureteral stent was brought to the urethral meatus. Through the stent a zipwire was advanced up to the renal pelvis. The stent was then removed. a 6 french ureteral catheter was then instilled into the right ureteral orifice.  a gentle retrograde was obtained and findings noted above.  we then removed the cystoscope and cannulated the left ureteral orifice with a semirigid ureteroscope.  We located the stone in the proximal ureter. Using a 200 nm laser fiber and fragmented the stone into smaller pieces.  the pieces were then removed with a engage basket.   once all stone fragments were removed we then placed a 6 x 26 double-j ureteral stent over the original zip wire.  We then removed the wire and good coil was noted in the the renal pelvis under fluoroscopy and the bladder under direct vision. the stone fragments were then removed from the bladder and sent for analysis.   the bladder was then drained and this concluded the procedure which was well tolerated by patient.  Complications: None  Condition: Stable, extubated, transferred to PACU  Plan: Patient is to be discharged home as to follow-up in 2 weeks. He is to remove his stent in 3 days by pulling the tether

## 2015-12-18 ENCOUNTER — Encounter (HOSPITAL_BASED_OUTPATIENT_CLINIC_OR_DEPARTMENT_OTHER): Payer: Self-pay | Admitting: Urology

## 2015-12-20 ENCOUNTER — Encounter (HOSPITAL_BASED_OUTPATIENT_CLINIC_OR_DEPARTMENT_OTHER): Payer: Self-pay | Admitting: Urology

## 2016-08-12 IMAGING — US US SCROTUM
1 series · 13 of 25 positions shown · non-contrast
Comparison: None.

CLINICAL DATA: Right scrotal/groin pain for 1 day. History of
sarcoidosis.

EXAM:
SCROTAL ULTRASOUND
DOPPLER ULTRASOUND OF THE TESTICLES
TECHNIQUE: Complete ultrasound examination of the testicles, epididymis, and
other scrotal structures was performed. Color and spectral Doppler
ultrasound were also utilized to evaluate blood flow to the
testicles.

[Series 1: us scrotum · 0.07mm/px · 13 of 78 slices shown]
[im 1/78]
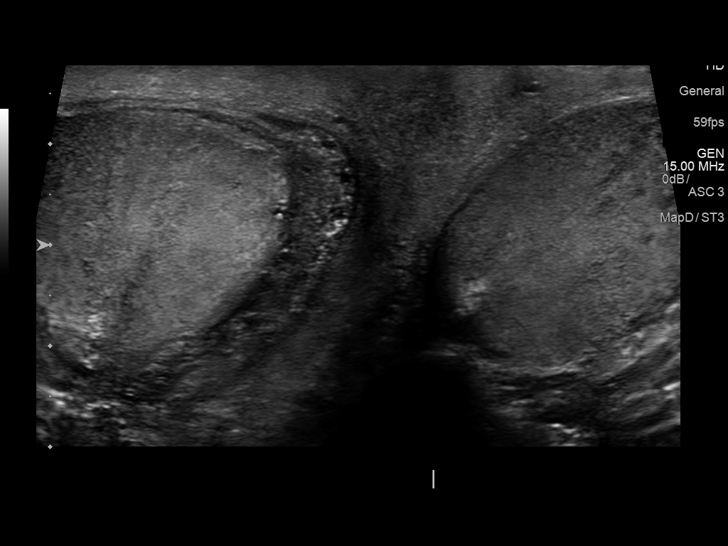
[im 7/78]
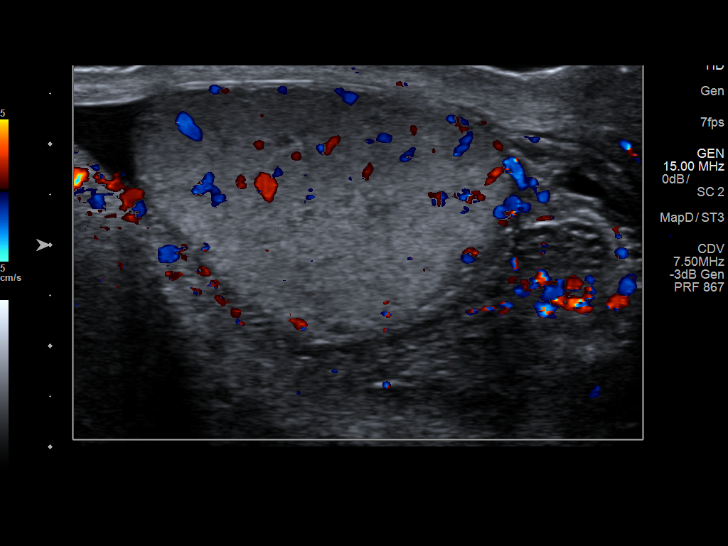
[im 13/78]
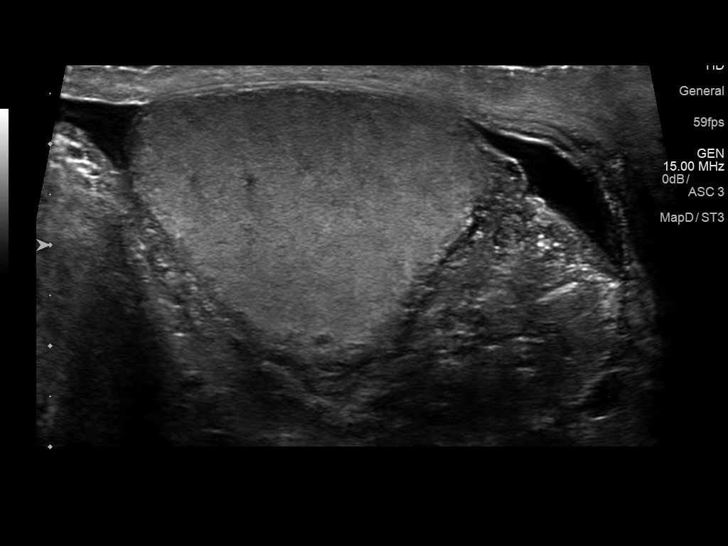
[im 20/78]
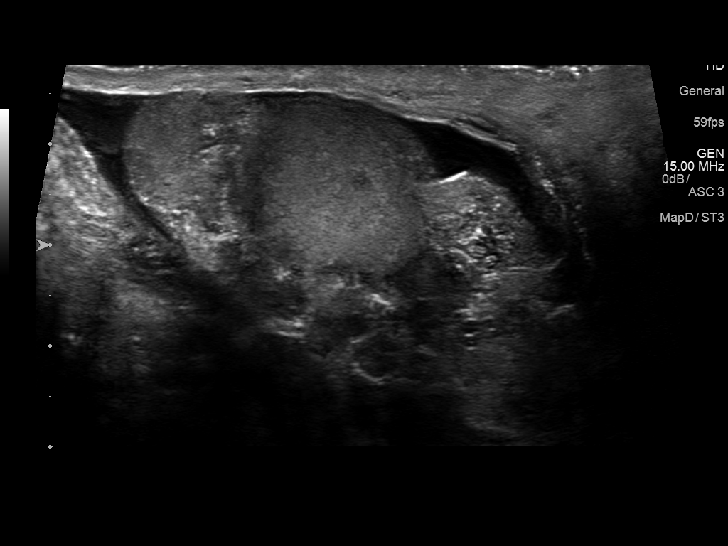
[im 26/78]
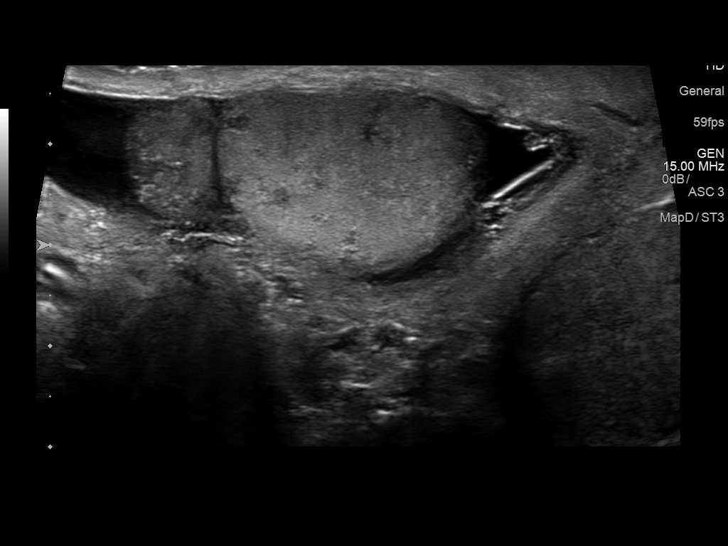
[im 33/78]
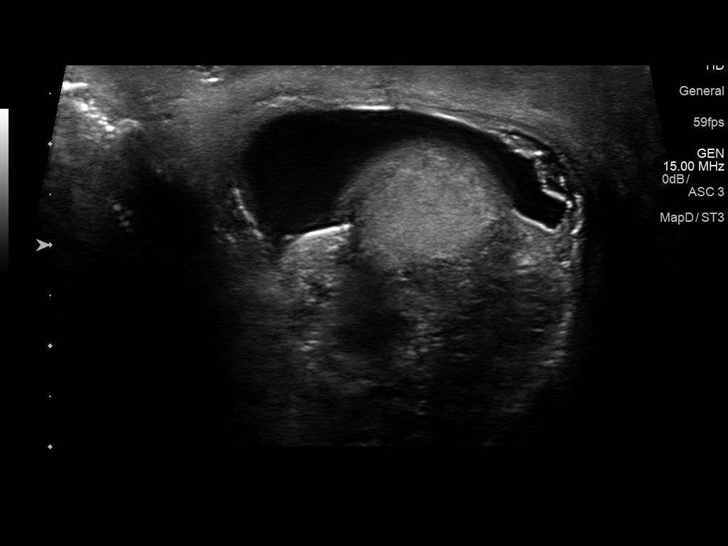
[im 39/78]
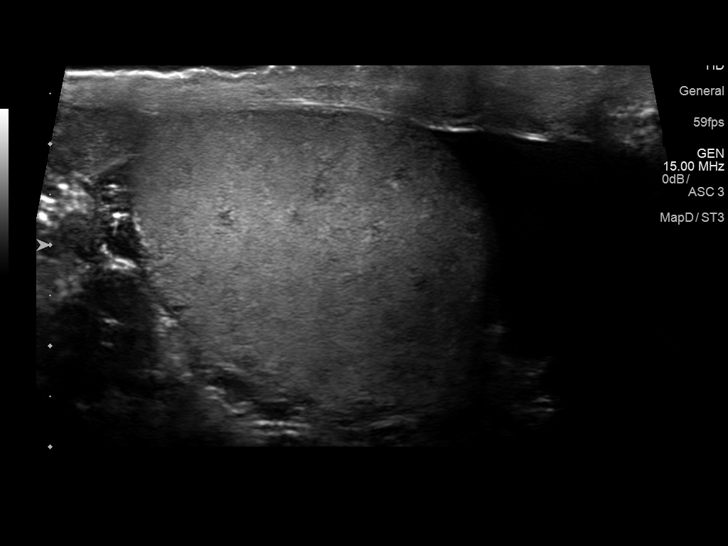
[im 45/78]
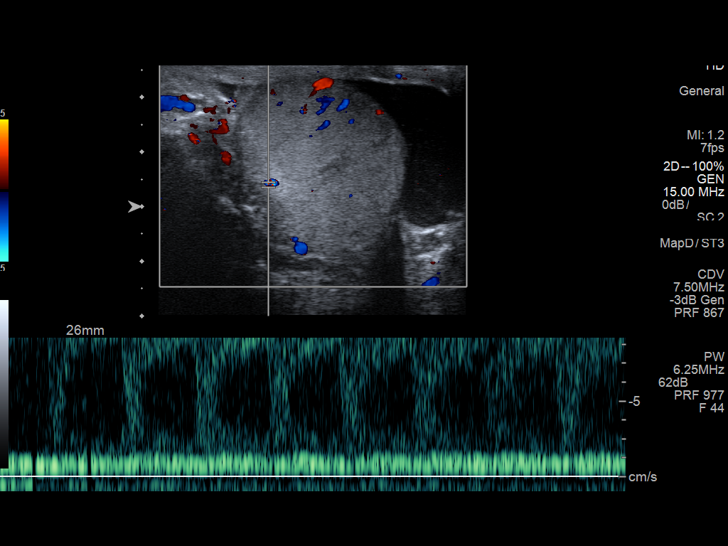
[im 52/78]
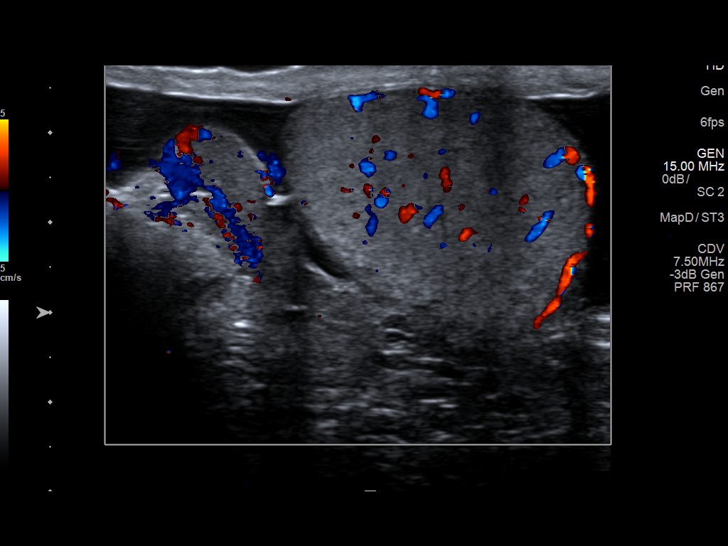
[im 58/78]
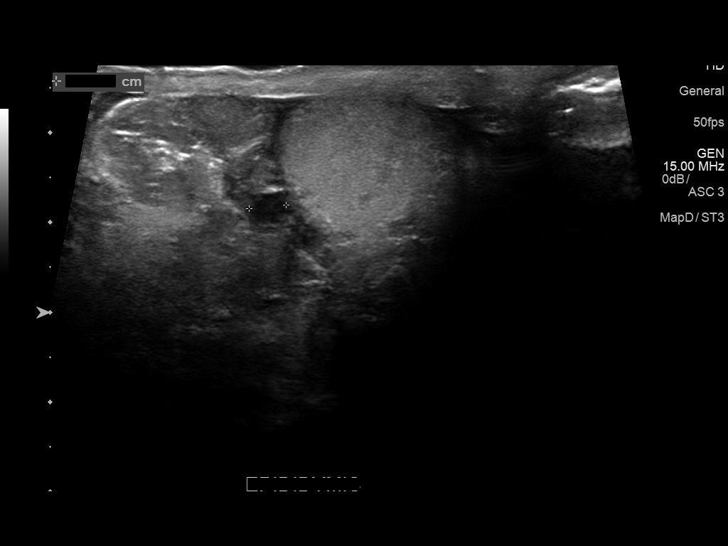
[im 65/78]
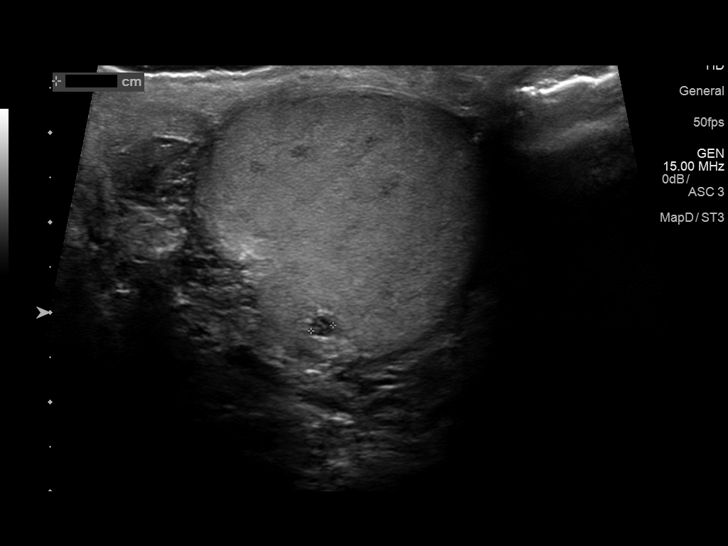
[im 71/78]
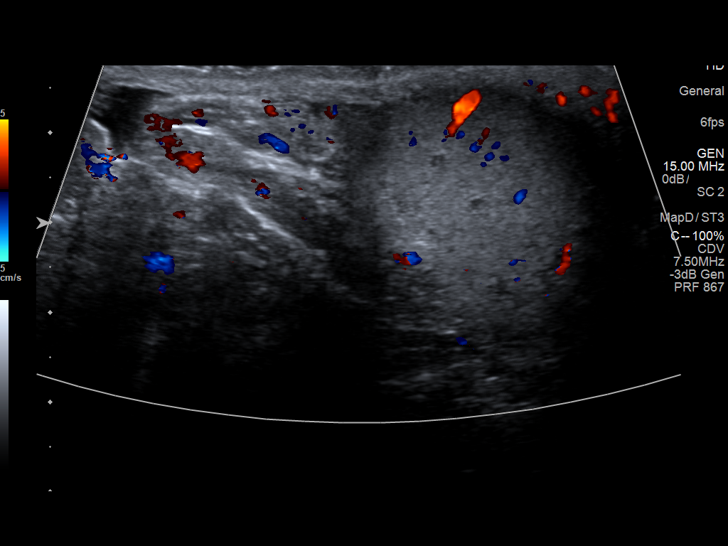
[im 78/78]
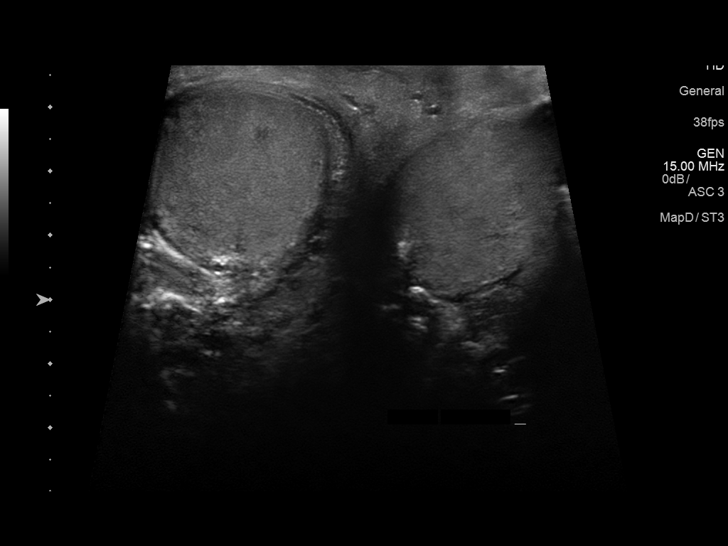

[13 of 25 positions shown; findings below may reference images not displayed]

FINDINGS: Right testicle

Measurements: 3.8 x 2.5 x 2.9 cm. There are multiple (greater than
10) tiny 2-3 mm hypodense lesions scattered throughout the right
testis.

Left testicle

Measurements: 4.0 x 3.0 x 3.1 cm. There are multiple (greater than
10) tiny 2-3 mm hypodense lesions scattered throughout the left
testis.

Right epididymis: Normal in size and appearance. No mass. No
hyperemia.

Left epididymis: Tiny simple 5 x 4 x 4 mm cyst versus spermatocele
in the left epididymal head. Otherwise normal in size and
appearance.

Hydrocele:  Small bilateral hydroceles.

Varicocele:  None visualized.

Pulsed Doppler interrogation of both testes demonstrates normal low
resistance arterial and venous waveforms bilaterally.
IMPRESSION: 1. No evidence of testicular torsion.
2. Numerous tiny 2-3 mm hypodense lesions scattered throughout the
bilateral testes, nonspecific, probably due to the patient's history
of sarcoidosis. A follow-up scrotal sonogram in 3-6 months would be
useful to document stability.
3. Tiny simple cyst versus spermatocele in the left epididymal head.
No findings specific for acute epididymitis.
4. Small bilateral hydroceles.

## 2016-11-20 IMAGING — CT CT RENAL STONE PROTOCOL
2 of 3 series · 14 of 46 positions shown, 16 images · non-contrast
Comparison: CT abdomen pelvis - 05/11/2014

CLINICAL DATA: Right groin pain radiating to the lower abdomen.
Hematuria.

EXAM:
CT ABDOMEN AND PELVIS WITHOUT CONTRAST
TECHNIQUE: Multidetector CT imaging of the abdomen and pelvis was performed
following the standard protocol without IV contrast.

[Series 4: lung · axial · 0.91mm/px · z∈[-39,+77]mm · 11 of 68 slices shown, 13 images]
[im 5/68  soft-tissue]
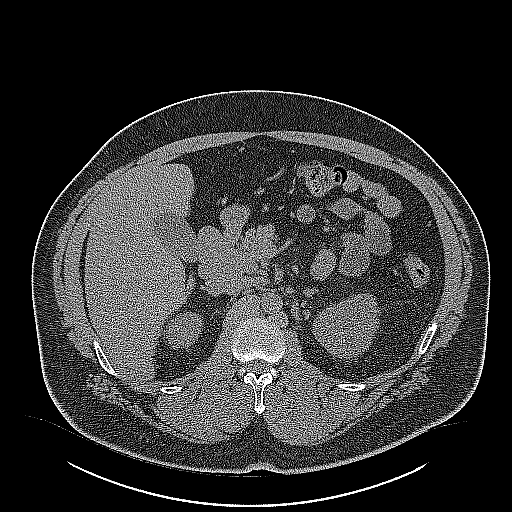
[im 5/68  bone]
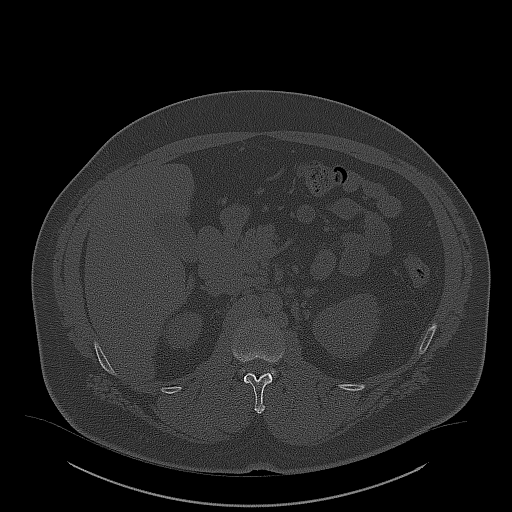
[im 11/68  soft-tissue]
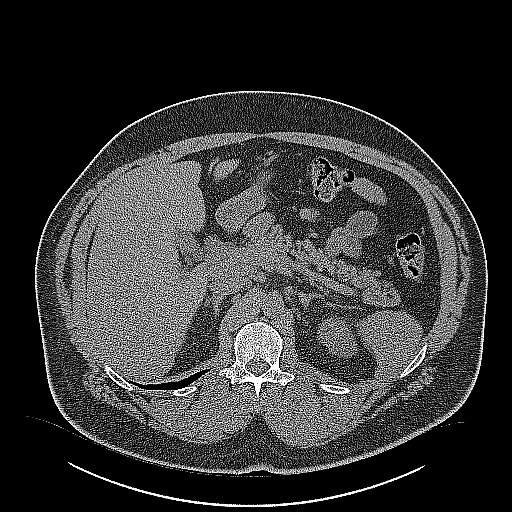
[im 16/68  soft-tissue]
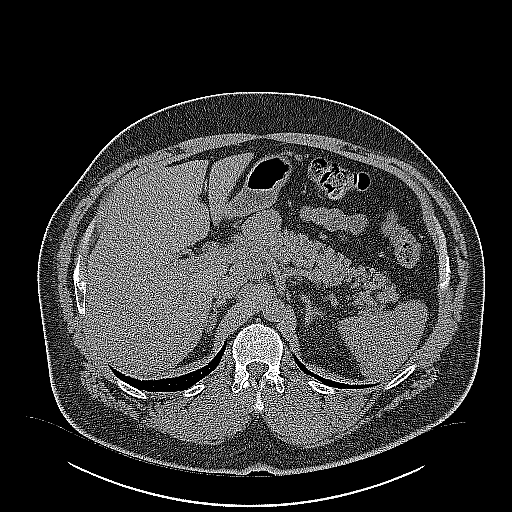
[im 22/68  soft-tissue]
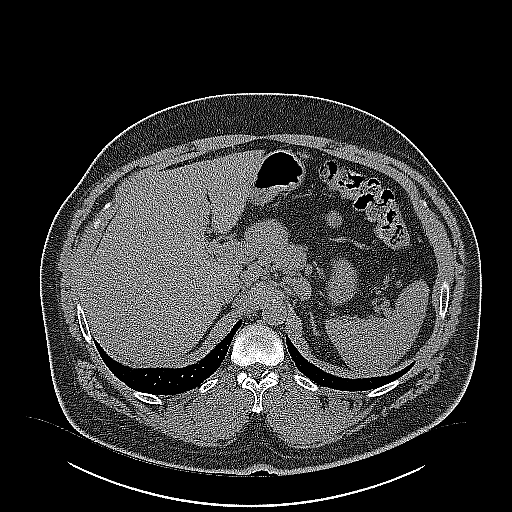
[im 29/68  soft-tissue]
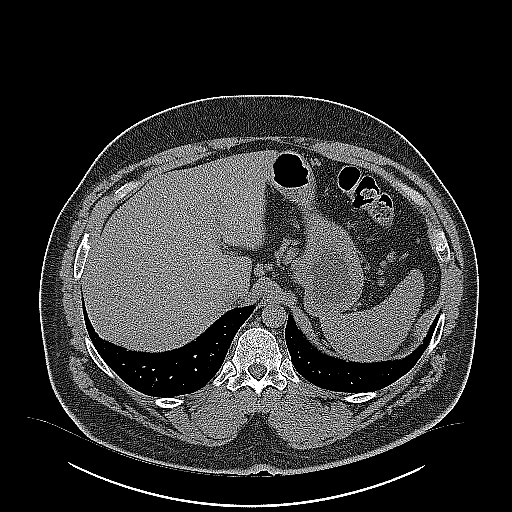
[im 35/68  soft-tissue]
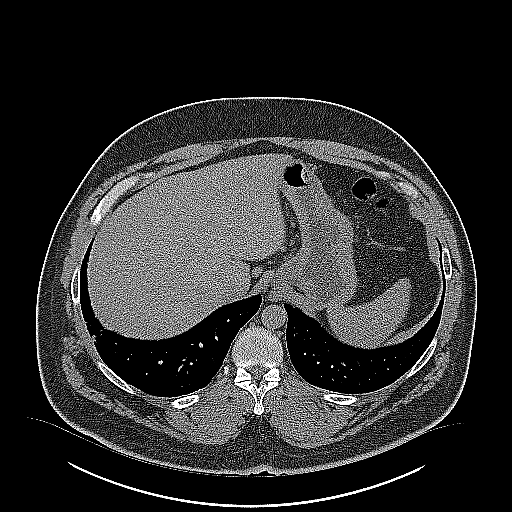
[im 39/68  soft-tissue]
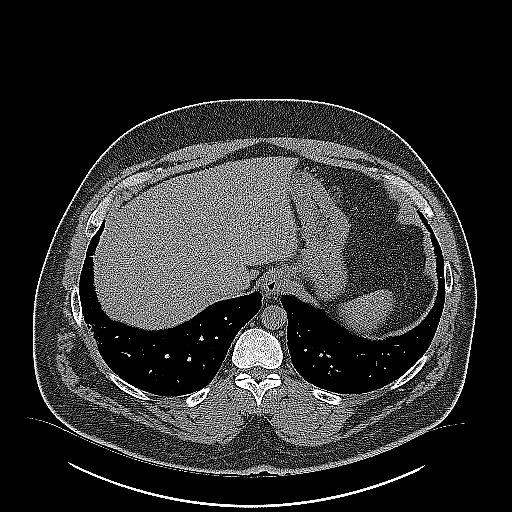
[im 46/68  soft-tissue]
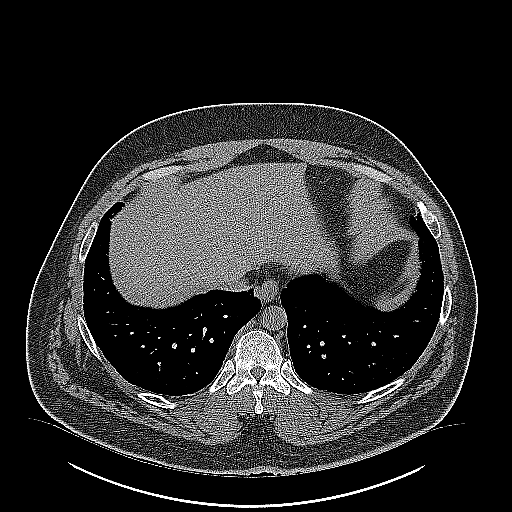
[im 52/68  soft-tissue]
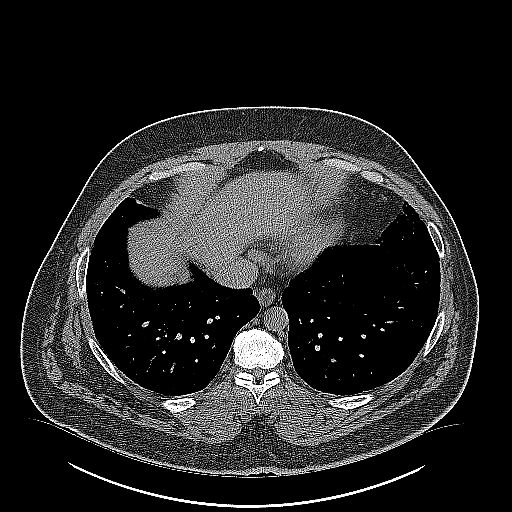
[im 52/68  bone]
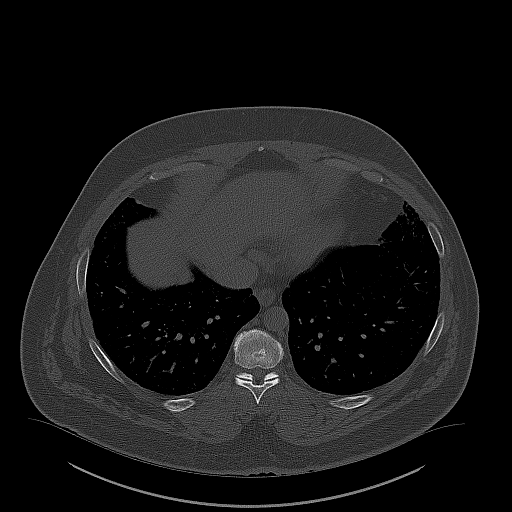
[im 57/68  soft-tissue]
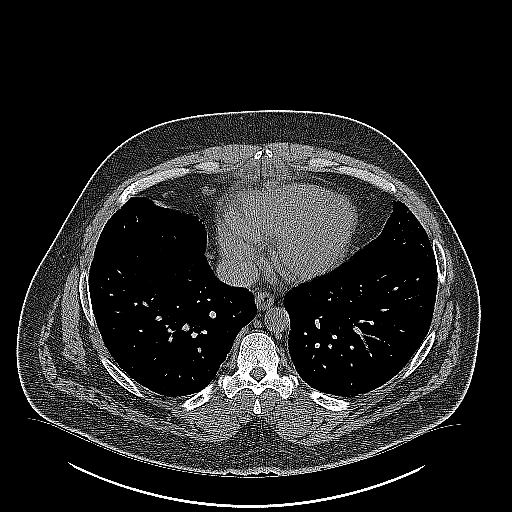
[im 63/68  soft-tissue]
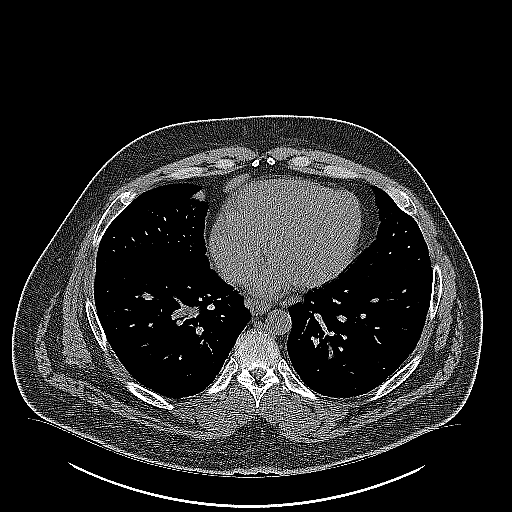

[Series 5: coronal · coronal · 0.81mm/px · 3 of 167 slices shown]
[im 56/167  soft-tissue]
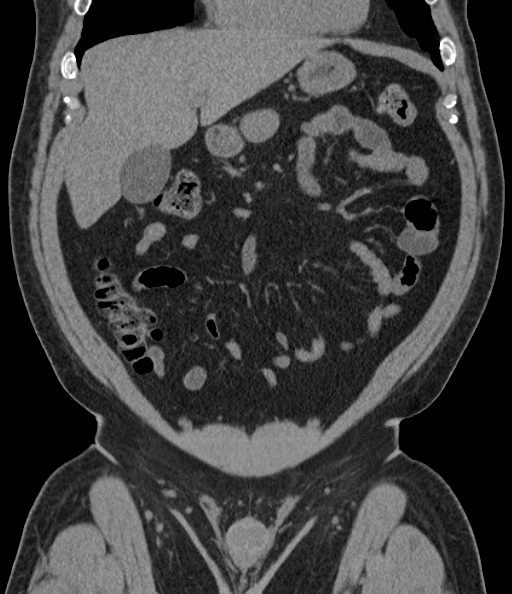
[im 74/167  soft-tissue]
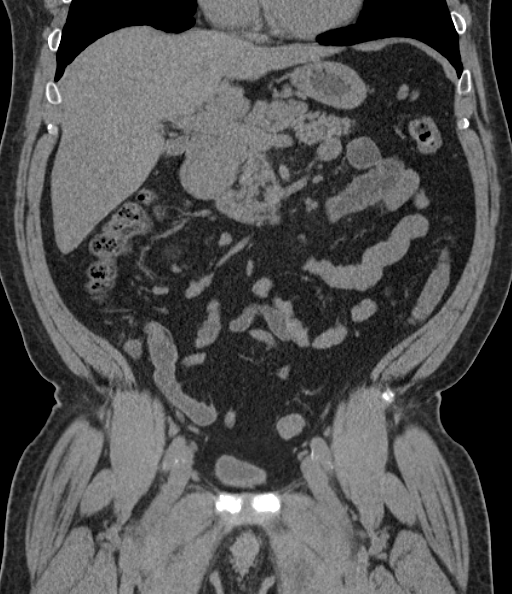
[im 93/167  soft-tissue]
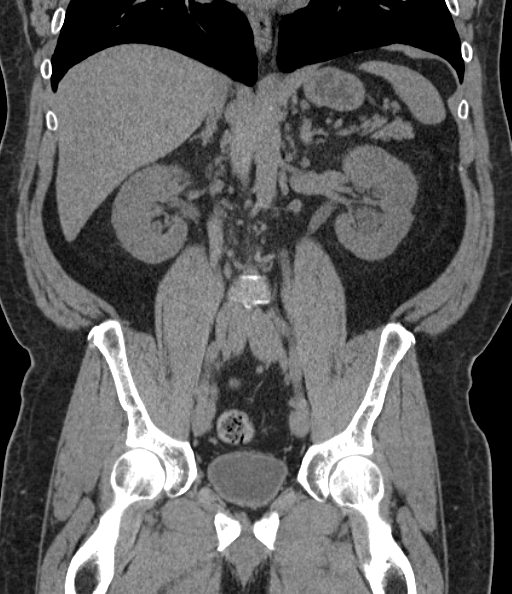

[14 of 46 positions shown; findings below may reference images not displayed]

FINDINGS: Lower chest: Limited visualization of the lower thorax is negative
for focal airspace opacity or pleural effusion.

There are innumerable punctate (2 mm) nodules which demonstrated
apparent perilymphatic distribution within in the imaged bilateral
lower lobes and inferior segments of the right middle lobe and
lingula (representative images 14 and 17, series 4). Unchanged
geographic subsegmental atelectasis/scar within the right
costophrenic angle (image 30, series 4, similar to the [DATE]
examination. No pleural effusion or focal airspace opacity.

Normal heart size.  No pericardial effusion.

Hepatobiliary: Normal hepatic contour. Normal noncontrast appearance
of the gallbladder given degree distention. No radiopaque
gallstones. No ascites.

Pancreas: Normal noncontrast appearance of the pancreas

Spleen: Normal noncontrast appearance of the spleen no evidence
splenomegaly. Incidental Dyllan De La Cruz splenule.

Adrenals/Urinary Tract: There is a punctate (approximately 7 mm)
stone within the superior aspect the right ureter which results in
mild upstream ureterectasis and pelvicaliectasis (coronal image 86,
series 5). There are 2 punctate stones within the superior aspect of
the left ureter with dominant stone measuring 6 mm in diameter in
small are more cranially located stone measuring 3 mm in diameter
(both stones seen on coronal image 90, series 5) with associated
mild upstream left-sided ureterectasis and pelvicaliectasis.

No additional renal stones identified. Several punctate phleboliths
are seen within the left gonadal vein. Several phleboliths are seen
with the lower pelvis bilaterally.

Normal noncontrast appearance of the urinary bladder given degree
distention.

Normal noncontrast appearance of the bilateral adrenal glands.

Stomach/Bowel: The colon is underdistended. Note is made of 2
partially calcified diverticulum within in the cecum. Bowel is
otherwise normal in course and caliber without wall thickening.
Normal noncontrast appearance of the terminal ileum and retrocecal
appendix. No pneumoperitoneum, pneumatosis or portal venous gas.

Vascular/Lymphatic: Normal caliber of the abdominal aorta.

Pericardiac and retroperitoneal lymph node adenopathy has decreased
in the interval with index pericardiac lymph node currently 8 mm in
diameter (image 3, series 2), previously, 1.1 cm, index
gastrohepatic ligament lymph node now measuring 1.4 cm in diameter
(image 18, series 2), previously, 2 cm, and index pre caval lymph
node measures 2.7 cm in greatest short axis diameter (image 27,
series 2), previously, 4 cm.

Reproductive: Dystrophic calcifications within normal sized prostate
gland. No free fluid in the pelvic cul-de-sac.

Other: Small (approximately 2.7 x 3.5 cm mesenteric fat containing
periumbilical hernia.

Musculoskeletal: No acute or aggressive osseous abnormalities.
Suspected avascular necrosis involving the right femoral head
without articular surface collapse or degenerative change (coronal
image 95, series 5).
IMPRESSION: 1. Bilateral punctate ureteral stones result in mild bilateral
upstream ureterectasis and pelvicaliectasis. Right-sided ureteral
stone measures 7 mm in diameter. There are 2 left-sided renal stones
measuring 6 mm and 3 mm in diameter respectively.
2. No evidence of nonobstructing nephrolithiasis.
3. Improved retroperitoneal and pericardial adenopathy suggestive of
improved sarcoidosis.
4. Innumerable punctate (approximately 2 mm) nodules within the
imaged lung bases with suspected perilymphatic distribution
compatible with provided history of sarcoidosis. No evidence of
fibrosis.
5. Suspected avascular necrosis involving the right femoral head
without associated articular surface collapse or degenerative
change.
